# Patient Record
Sex: Male | Born: 1990
Health system: Southern US, Community
[De-identification: ages and names within clinical notes are randomized; demographics above are authoritative.]

## PROBLEM LIST (undated history)

## (undated) DIAGNOSIS — F649 Gender identity disorder, unspecified: Secondary | ICD-10-CM

## (undated) DIAGNOSIS — F419 Anxiety disorder, unspecified: Secondary | ICD-10-CM

## (undated) DIAGNOSIS — J45909 Unspecified asthma, uncomplicated: Secondary | ICD-10-CM

## (undated) DIAGNOSIS — E669 Obesity, unspecified: Secondary | ICD-10-CM

## (undated) DIAGNOSIS — J189 Pneumonia, unspecified organism: Secondary | ICD-10-CM

## (undated) HISTORY — DX: Pneumonia, unspecified organism: J18.9

## (undated) HISTORY — DX: Gender identity disorder, unspecified: F64.9

## (undated) HISTORY — DX: Obesity, unspecified: E66.9

## (undated) HISTORY — DX: Anxiety disorder, unspecified: F41.9

## (undated) HISTORY — PX: NO PAST SURGERIES: SHX2092

## (undated) HISTORY — PX: MASTECTOMY: SHX3

---

## 2017-02-02 DIAGNOSIS — E281 Androgen excess: Secondary | ICD-10-CM | POA: Diagnosis not present

## 2017-03-30 DIAGNOSIS — E281 Androgen excess: Secondary | ICD-10-CM | POA: Diagnosis not present

## 2017-03-30 DIAGNOSIS — D751 Secondary polycythemia: Secondary | ICD-10-CM | POA: Diagnosis not present

## 2017-03-30 DIAGNOSIS — K719 Toxic liver disease, unspecified: Secondary | ICD-10-CM | POA: Diagnosis not present

## 2017-08-10 DIAGNOSIS — J101 Influenza due to other identified influenza virus with other respiratory manifestations: Secondary | ICD-10-CM | POA: Diagnosis not present

## 2018-02-04 DIAGNOSIS — F640 Transsexualism: Secondary | ICD-10-CM | POA: Diagnosis not present

## 2018-03-01 DIAGNOSIS — F641 Gender identity disorder in adolescence and adulthood: Secondary | ICD-10-CM | POA: Diagnosis not present

## 2018-03-04 DIAGNOSIS — F640 Transsexualism: Secondary | ICD-10-CM | POA: Diagnosis not present

## 2018-04-20 DIAGNOSIS — F4323 Adjustment disorder with mixed anxiety and depressed mood: Secondary | ICD-10-CM | POA: Diagnosis not present

## 2018-04-20 DIAGNOSIS — F4325 Adjustment disorder with mixed disturbance of emotions and conduct: Secondary | ICD-10-CM | POA: Diagnosis not present

## 2018-04-20 DIAGNOSIS — F640 Transsexualism: Secondary | ICD-10-CM | POA: Diagnosis not present

## 2018-04-28 DIAGNOSIS — F640 Transsexualism: Secondary | ICD-10-CM | POA: Diagnosis not present

## 2018-04-28 DIAGNOSIS — Z79890 Hormone replacement therapy (postmenopausal): Secondary | ICD-10-CM | POA: Diagnosis not present

## 2018-04-29 LAB — HM PAP SMEAR: HM Pap smear: NEGATIVE

## 2018-05-02 DIAGNOSIS — F640 Transsexualism: Secondary | ICD-10-CM | POA: Insufficient documentation

## 2018-05-02 DIAGNOSIS — Z789 Other specified health status: Secondary | ICD-10-CM | POA: Insufficient documentation

## 2018-06-03 DIAGNOSIS — F4325 Adjustment disorder with mixed disturbance of emotions and conduct: Secondary | ICD-10-CM | POA: Diagnosis not present

## 2018-06-03 DIAGNOSIS — F4323 Adjustment disorder with mixed anxiety and depressed mood: Secondary | ICD-10-CM | POA: Diagnosis not present

## 2018-06-03 DIAGNOSIS — F640 Transsexualism: Secondary | ICD-10-CM | POA: Diagnosis not present

## 2018-08-24 DIAGNOSIS — F4325 Adjustment disorder with mixed disturbance of emotions and conduct: Secondary | ICD-10-CM | POA: Diagnosis not present

## 2018-08-24 DIAGNOSIS — F640 Transsexualism: Secondary | ICD-10-CM | POA: Diagnosis not present

## 2018-08-24 DIAGNOSIS — F4323 Adjustment disorder with mixed anxiety and depressed mood: Secondary | ICD-10-CM | POA: Diagnosis not present

## 2018-08-24 DIAGNOSIS — F4011 Social phobia, generalized: Secondary | ICD-10-CM | POA: Diagnosis not present

## 2018-09-02 DIAGNOSIS — F4325 Adjustment disorder with mixed disturbance of emotions and conduct: Secondary | ICD-10-CM | POA: Diagnosis not present

## 2018-09-02 DIAGNOSIS — F640 Transsexualism: Secondary | ICD-10-CM | POA: Diagnosis not present

## 2018-09-02 DIAGNOSIS — F4323 Adjustment disorder with mixed anxiety and depressed mood: Secondary | ICD-10-CM | POA: Diagnosis not present

## 2018-09-20 ENCOUNTER — Encounter: Payer: Self-pay | Admitting: Physician Assistant

## 2018-09-20 ENCOUNTER — Ambulatory Visit: Payer: Self-pay | Admitting: Physician Assistant

## 2018-09-20 ENCOUNTER — Ambulatory Visit (INDEPENDENT_AMBULATORY_CARE_PROVIDER_SITE_OTHER): Payer: BLUE CROSS/BLUE SHIELD | Admitting: Physician Assistant

## 2018-09-20 VITALS — Ht 61.0 in | Wt 210.0 lb

## 2018-09-20 DIAGNOSIS — R0609 Other forms of dyspnea: Secondary | ICD-10-CM

## 2018-09-20 DIAGNOSIS — Z7289 Other problems related to lifestyle: Secondary | ICD-10-CM | POA: Insufficient documentation

## 2018-09-20 DIAGNOSIS — Z832 Family history of diseases of the blood and blood-forming organs and certain disorders involving the immune mechanism: Secondary | ICD-10-CM

## 2018-09-20 DIAGNOSIS — F411 Generalized anxiety disorder: Secondary | ICD-10-CM | POA: Diagnosis not present

## 2018-09-20 DIAGNOSIS — Z131 Encounter for screening for diabetes mellitus: Secondary | ICD-10-CM

## 2018-09-20 DIAGNOSIS — Z1329 Encounter for screening for other suspected endocrine disorder: Secondary | ICD-10-CM

## 2018-09-20 DIAGNOSIS — F410 Panic disorder [episodic paroxysmal anxiety] without agoraphobia: Secondary | ICD-10-CM | POA: Insufficient documentation

## 2018-09-20 DIAGNOSIS — E6609 Other obesity due to excess calories: Secondary | ICD-10-CM | POA: Insufficient documentation

## 2018-09-20 DIAGNOSIS — Z7722 Contact with and (suspected) exposure to environmental tobacco smoke (acute) (chronic): Secondary | ICD-10-CM

## 2018-09-20 DIAGNOSIS — E66812 Obesity, class 2: Secondary | ICD-10-CM | POA: Insufficient documentation

## 2018-09-20 DIAGNOSIS — R0600 Dyspnea, unspecified: Secondary | ICD-10-CM

## 2018-09-20 DIAGNOSIS — F331 Major depressive disorder, recurrent, moderate: Secondary | ICD-10-CM | POA: Diagnosis not present

## 2018-09-20 DIAGNOSIS — Z7689 Persons encountering health services in other specified circumstances: Secondary | ICD-10-CM

## 2018-09-20 MED ORDER — ALPRAZOLAM 0.25 MG PO TABS: 0.2500 mg | ORAL_TABLET | Freq: Once | ORAL | 0 refills | Status: DC | PRN

## 2018-09-20 MED ORDER — ESCITALOPRAM OXALATE 10 MG PO TABS: ORAL_TABLET | ORAL | 0 refills | Status: DC

## 2018-09-20 MED ORDER — BUDESONIDE-FORMOTEROL FUMARATE 80-4.5 MCG/ACT IN AERO: 2.0000 | INHALATION_SPRAY | Freq: Two times a day (BID) | RESPIRATORY_TRACT | 0 refills | Status: DC

## 2018-09-20 NOTE — Progress Notes (Signed)
Virtual Visit via Video Note  I connected with Peter Mccullough on 09/20/18 at  3:00 PM EDT by a video enabled telemedicine application and verified that I am speaking with the correct person using two identifiers.   I discussed the limitations of evaluation and management by telemedicine and the availability of in person appointments. The patient expressed understanding and agreed to proceed.  History of Present Illness: HPI:                                                                Peter Mccullough is a 28 y.o. adult   CC: establish care, SOB, anxiety  Peter Mccullough reports long-standing, chronic "breathing issues" for most of his life, but gradually worsening over the last 3 years. States "I feel like I'm drowning in my lungs."  Worse mainly with exertion and hot temperatures and seems to be worse in the spring season. 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 Endorses wheezing and intermittent dry cough and rare sharp retrosternal chest pain that radiates to the right shoulder Endorses chronic secondhand smoke exposure for 20 years Mother had sarcoidosis No prior work-up or treatments  Anxiety/Depression: Endorses longstanding social anxiety and panic attacks. Has been seeing a therapist since August 2018 (Peter Mccullough). Therapist recommend he follow-up with PCP to discuss anxiety medication. Therapist also suggested he may have seasonal affective disorder.  Reports in 2011 (age 24) he was forced to see a counselor through his school for suicidal ideations. He states he was cutting at the time and had felt like he didn't want to continue living. He states these feelings come and go. He last cut May 18, 2018; prior to this had not cut for 2 years.  He states he recurrently feels hopeless, numb and lethargic especially when his gender dysphoria is flaring  up Reports he has been having trouble sleeping for several months and has been taking Melatonin 5 mg nightly for the last few weeks. Has tried relaxation exercises and this was not helpful.   Reports he was having panic attacks daily for a couple of weeks, triggered by health concerns due to COVID-19. States symptoms have made it difficult to go to work at [employer]  He lives at home with his uncle and has a supportive girlfriend and a close friend who is like family.  No prior medications or hospitalizations. No substance use.   Depression screen PHQ 2/9 09/20/2018  Decreased Interest 1  Down, Depressed, Hopeless 2  PHQ - 2 Score 3  Altered sleeping 3  Tired, decreased energy 2  Change in appetite 1  Feeling bad or failure about yourself  2  Trouble concentrating 2  Moving slowly or fidgety/restless 1  Suicidal thoughts 0  PHQ-9 Score 14    GAD 7 : Generalized Anxiety Score 09/20/2018  Nervous, Anxious, on Edge 2  Control/stop worrying 1  Worry too much - different things 2  Trouble relaxing 2  Restless 1  Easily annoyed or irritable 2  Afraid - awful might happen 2  Total GAD 7 Score 12      Past Medical History:  Diagnosis Date  . Anxiety   .  Gender dysphoria   . Obesity    Past Surgical History:  Procedure Laterality Date  . NO PAST SURGERIES     Social History   Tobacco Use  . Smoking status: Passive Smoke Exposure - Never Smoker  . Smokeless tobacco: Never Used  Substance Use Topics  . Alcohol use: Not Currently    Comment: 1 drink/year   family history includes COPD in his maternal uncle; Sarcoidosis in his mother.    Review of Systems  Respiratory: Positive for chest tightness, shortness of breath and wheezing.   Skin:       + hair thinning  Psychiatric/Behavioral: Positive for dysphoric mood, self-injury (cutting, 12/19), sleep disturbance and suicidal ideas (chronic, passive). The patient is nervous/anxious.   All other systems reviewed and are  negative.    Medications: Current Outpatient Medications  Medication Sig Dispense Refill  . Melatonin 5 MG CAPS Take by mouth.    . Multiple Vitamin (MULTIVITAMIN) capsule Take 1 capsule by mouth daily.    Marland Kitchen NEEDLE, DISP, 25 G (BD HYPODERMIC NEEDLE) 25G X 1-1/2" MISC For testosterone Injection    . SYRINGE-NEEDLE, DISP, 3 ML (B-D 3CC LUER-LOK SYR 18GX1-1/2) 18G X 1-1/2" 3 ML MISC For drawing Testosterone    . Testosterone Cypionate 200 MG/ML SOLN Inject 120 mg into the muscle every 14 (fourteen) days.    . ALPRAZolam (XANAX) 0.25 MG tablet Take 1-2 tablets (0.25-0.5 mg total) by mouth once as needed for up to 1 dose for anxiety. 20 tablet 0  . budesonide-formoterol (SYMBICORT) 80-4.5 MCG/ACT inhaler Inhale 2 puffs into the lungs 2 (two) times a day. 1 Inhaler 0  . escitalopram (LEXAPRO) 10 MG tablet Take 0.5 tablets (5 mg total) by mouth at bedtime for 3 days, THEN 1 tablet (10 mg total) at bedtime for 27 days. 30 tablet 0   No current facility-administered medications for this visit.    No Known Allergies     Objective:  Ht  (1.549 m)   Wt 210 lb (95.3 kg)   BMI 39.68 kg/m  Gen:  alert, not ill-appearing, no distress, appropriate for age HEENT: head normocephalic without obvious abnormality, conjunctiva and cornea clear, trachea midline Pulm: Normal work of breathing, normal phonation, speaking in full sentences Neuro: alert and oriented x 3, no tremor MSK: extremities atraumatic, normal gait and station Skin: intact, no rashes on exposed skin, no jaundice, no cyanosis Psych: well-groomed, cooperative, good eye contact, euthymic mood, affect mood-congruent, speech is articulate, and thought processes clear and goal-directed  Labs Care Everywhere 04/28/2018  WBC 8.6 RBC 5.83 Hgb 17.5 Plt 320  Vital Signs 04/28/2018 BP 140/72 Pulse 76   No results found for this or any previous visit (from the past 72 hour(s)). No results found.    Assessment and Plan: 28  y.o. adult with   .Peter Mccullough was seen today for establish care.  Diagnoses and all orders for this visit:  Encounter to establish care  Moderate episode of recurrent major depressive disorder (HCC) -     escitalopram (LEXAPRO) 10 MG tablet; Take 0.5 tablets (5 mg total) by mouth at bedtime for 3 days, THEN 1 tablet (10 mg total) at bedtime for 27 days.  Generalized anxiety disorder with panic attacks -     escitalopram (LEXAPRO) 10 MG tablet; Take 0.5 tablets (5 mg total) by mouth at bedtime for 3 days, THEN 1 tablet (10 mg total) at bedtime for 27 days. -     ALPRAZolam (XANAX) 0.25 MG tablet;  Take 1-2 tablets (0.25-0.5 mg total) by mouth once as needed for up to 1 dose for anxiety.  Family history of sarcoidosis Comments: mother Orders: -     CBC with Differential/Platelet -     COMPLETE METABOLIC PANEL WITH GFR -     Sedimentation rate -     C-reactive protein -     TSH + free T4 -     B Nat Peptide -     DG Chest 2 View -     QuantiFERON-TB Gold Plus  Chronic dyspnea -     budesonide-formoterol (SYMBICORT) 80-4.5 MCG/ACT inhaler; Inhale 2 puffs into the lungs 2 (two) times a day. -     CBC with Differential/Platelet -     COMPLETE METABOLIC PANEL WITH GFR -     Sedimentation rate -     C-reactive protein -     TSH + free T4 -     B Nat Peptide -     DG Chest 2 View -     QuantiFERON-TB Gold Plus  Passive smoke exposure  Class 2 obesity due to excess calories in adult, unspecified BMI, unspecified whether serious comorbidity present -     Hemoglobin A1c -     TSH + free T4  Screening for diabetes mellitus -     Hemoglobin A1c  Screening for thyroid disorder -     TSH + free T4   - Personally reviewed PMH, PSH, PFH, medications, allergies, HM - Personally reviewed labs Care Everywhere 04/2018 - Age-appropriate cancer screening: Pap UTD 04/28/18, NILM, repeat 3 years - Positive depression screening (See plan below)  MDD, Panic Disorder, Hx of cutting GAD7=12,  PHQ9=14, no acute safety issues, safety plan reviewed and written plan sent via MyChart Lexapro self-titration Reviewed mechanism, potential AE and suicidality warning for SSRI Alprazolam prn for panic attacks, reviewed risks of benzodiazepines including dependence Keep f/u with counselor  Chronic Dyspnea,  Chronic passive smoke exposure, subjective wheezing, Fam hx of sarcoidosis in mother DDx is broad and includes reactive airway disease, obesity-hypoventilation syndrome, restrictive lung disease, panic disorder, deconditioning No vital signs provided today and limited physical exam due to e-visit. However, reassuring that symptoms have been unchanged for 3 years and emergent cause including PE is unlikely CXR and labs pending. Recent CBC wnl, which r/o anemia Will need spirometry at next OV Trial of Symbicort, counseled on MDI technique  Follow-up in office in 1 month or sooner as needed   Follow Up Instructions:    I discussed the assessment and treatment plan with the patient. The patient was provided an opportunity to ask questions and all were answered. The patient agreed with the plan and demonstrated an understanding of the instructions.   The patient was advised to call back or seek an in-person evaluation if the symptoms worsen or if the condition fails to improve as anticipated.  I provided 45 minutes of non-face-to-face time during this encounter.   Carlis Stableharley Elizabeth , [place]PA-C

## 2018-09-24 ENCOUNTER — Encounter: Payer: Self-pay | Admitting: Physician Assistant

## 2018-10-12 ENCOUNTER — Other Ambulatory Visit: Payer: Self-pay | Admitting: Physician Assistant

## 2018-10-12 DIAGNOSIS — R0609 Other forms of dyspnea: Secondary | ICD-10-CM

## 2018-10-12 DIAGNOSIS — F331 Major depressive disorder, recurrent, moderate: Secondary | ICD-10-CM

## 2018-10-12 DIAGNOSIS — F410 Panic disorder [episodic paroxysmal anxiety] without agoraphobia: Secondary | ICD-10-CM

## 2018-10-14 ENCOUNTER — Ambulatory Visit (INDEPENDENT_AMBULATORY_CARE_PROVIDER_SITE_OTHER): Payer: BLUE CROSS/BLUE SHIELD

## 2018-10-14 ENCOUNTER — Ambulatory Visit (INDEPENDENT_AMBULATORY_CARE_PROVIDER_SITE_OTHER): Payer: BLUE CROSS/BLUE SHIELD | Admitting: Physician Assistant

## 2018-10-14 ENCOUNTER — Other Ambulatory Visit: Payer: Self-pay

## 2018-10-14 ENCOUNTER — Encounter: Payer: Self-pay | Admitting: Physician Assistant

## 2018-10-14 VITALS — BP 121/66 | HR 81 | Temp 98.3°F | Resp 14 | Ht 60.0 in | Wt 211.0 lb

## 2018-10-14 DIAGNOSIS — R0600 Dyspnea, unspecified: Secondary | ICD-10-CM | POA: Diagnosis not present

## 2018-10-14 DIAGNOSIS — Z131 Encounter for screening for diabetes mellitus: Secondary | ICD-10-CM | POA: Diagnosis not present

## 2018-10-14 DIAGNOSIS — F331 Major depressive disorder, recurrent, moderate: Secondary | ICD-10-CM

## 2018-10-14 DIAGNOSIS — R0602 Shortness of breath: Secondary | ICD-10-CM | POA: Diagnosis not present

## 2018-10-14 DIAGNOSIS — Z832 Family history of diseases of the blood and blood-forming organs and certain disorders involving the immune mechanism: Secondary | ICD-10-CM | POA: Diagnosis not present

## 2018-10-14 DIAGNOSIS — Z9189 Other specified personal risk factors, not elsewhere classified: Secondary | ICD-10-CM | POA: Diagnosis not present

## 2018-10-14 DIAGNOSIS — F411 Generalized anxiety disorder: Secondary | ICD-10-CM

## 2018-10-14 DIAGNOSIS — R0683 Snoring: Secondary | ICD-10-CM

## 2018-10-14 DIAGNOSIS — R0609 Other forms of dyspnea: Secondary | ICD-10-CM

## 2018-10-14 DIAGNOSIS — F410 Panic disorder [episodic paroxysmal anxiety] without agoraphobia: Secondary | ICD-10-CM

## 2018-10-14 DIAGNOSIS — Z1329 Encounter for screening for other suspected endocrine disorder: Secondary | ICD-10-CM | POA: Diagnosis not present

## 2018-10-14 MED ORDER — ESCITALOPRAM OXALATE 10 MG PO TABS: 10.0000 mg | ORAL_TABLET | Freq: Every day | ORAL | 0 refills | Status: DC

## 2018-10-14 MED ORDER — BUDESONIDE-FORMOTEROL FUMARATE 80-4.5 MCG/ACT IN AERO: 2.0000 | INHALATION_SPRAY | Freq: Two times a day (BID) | RESPIRATORY_TRACT | 3 refills | Status: DC

## 2018-10-14 NOTE — Progress Notes (Signed)
HPI:                                                                Peter Mccullough is a 28 y.o. adult who presents to [facility] today for anxiety and SOB follow-up  Anxiety/Depression: Reports anxiety has improved since starting Lexapro 10 mg approx 3 weeks ago. Has not had any panic attacks since last visit. Has not needed to use Alprazolam  Endorses longstanding social anxiety and panic attacks. Has been seeing a therapist since August 2018 (Dr. Carolin Sicks). Therapist recommend he follow-up with PCP to discuss anxiety medication. Therapist also suggested he may have seasonal affective disorder.  Reports in 2011 (age 85) he was forced to see a counselor through his school for suicidal ideations. He states he was cutting at the time and had felt like he didn't want to continue living. He states these feelings come and go. He last cut May 18, 2018; prior to this had not cut for 2 years.  He states he recurrently feels hopeless, numb and lethargic especially when his gender dysphoria is flaring up Reports he has been having trouble sleeping for several months and has been taking Melatonin 5 mg nightly for the last few weeks. Has tried relaxation exercises and this was not helpful.   Reports he was having panic attacks daily for a couple of weeks, triggered by health concerns due to COVID-19. States symptoms have made it difficult to go to work at [employer]  He lives at home with his uncle and has a supportive girlfriend and a close friend who is like family.  No prior medications or hospitalizations. No substance use.  SOB: - reports Symbicort has been helpful for SOB. SOB has been worse with activity, especially wearing a mask at work. Requesting letter to take voluntary leave of absence - continues to endorse barking cough, wheezing and chest tightness - endorses loud snoring. Denies witness apnea. No orthopnea or PND  Peter Mccullough  reports long-standing, chronic "breathing issues" for most of his life, but gradually worsening over the last 3 years. States "I feel like I'm drowning in my lungs."  Worse mainly with exertion and hot temperatures and seems to be worse in the spring season. 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 Endorses wheezing and intermittent dry cough and rare sharp retrosternal chest pain that radiates to the right shoulder Endorses chronic secondhand smoke exposure for 20 years Mother had sarcoidosis No prior work-up or treatments   Depression screen [facility] 2/9 10/14/2018 09/20/2018  Decreased Interest 1 1  Down, Depressed, Hopeless 1 2  PHQ - 2 Score 2 3  Altered sleeping 2 3  Tired, decreased energy 2 2  Change in appetite 0 1  Feeling bad or failure about yourself  1 2  Trouble concentrating 1 2  Moving slowly or fidgety/restless 1 1  Suicidal thoughts 1 0  PHQ-9 Score 10 14  Difficult doing work/chores Somewhat difficult -    GAD 7 : Generalized Anxiety Score 10/14/2018 09/20/2018  Nervous, Anxious, on Edge 1 2  Control/stop worrying 1 1  Worry too much -  different things 1 2  Trouble relaxing 1 2  Restless 1 1  Easily annoyed or irritable 1 2  Afraid - awful might happen 2 2  Total GAD 7 Score 8 12      Past Medical History:  Diagnosis Date  . Anxiety   . Gender dysphoria   . Obesity    Past Surgical History:  Procedure Laterality Date  . NO PAST SURGERIES     Social History   Tobacco Use  . Smoking status: Passive Smoke Exposure - Never Smoker  . Smokeless tobacco: Never Used  Substance Use Topics  . Alcohol use: Not Currently    Comment: 1 drink/year   family history includes COPD in his maternal uncle; Sarcoidosis in his mother.    ROS: negative except as noted in the HPI  Medications: Current Outpatient Medications  Medication  Sig Dispense Refill  . ALPRAZolam (XANAX) 0.25 MG tablet Take 1-2 tablets (0.25-0.5 mg total) by mouth once as needed for up to 1 dose for anxiety. 20 tablet 0  . budesonide-formoterol (SYMBICORT) 80-4.5 MCG/ACT inhaler Inhale 2 puffs into the lungs 2 (two) times a day. 10.2 Inhaler 3  . escitalopram (LEXAPRO) 10 MG tablet Take 1 tablet (10 mg total) by mouth at bedtime. 90 tablet 0  . Melatonin 5 MG CAPS Take by mouth.    . Multiple Vitamin (MULTIVITAMIN) capsule Take 1 capsule by mouth daily.    Marland Kitchen NEEDLE, DISP, 25 G (BD HYPODERMIC NEEDLE) 25G X 1-1/2" MISC For testosterone Injection    . SYRINGE-NEEDLE, DISP, 3 ML (B-D 3CC LUER-LOK SYR 18GX1-1/2) 18G X 1-1/2" 3 ML MISC For drawing Testosterone    . Testosterone Cypionate 200 MG/ML SOLN Inject 120 mg into the muscle every 14 (fourteen) days.     No current facility-administered medications for this visit.    No Known Allergies     Objective:  BP 121/66   Pulse 81   Temp 98.3 F (36.8 C) (Oral)   Resp 14   Ht 5' (1.524 m)   Wt 211 lb (95.7 kg)   SpO2 96%   BMI 41.21 kg/m  Gen:  alert, not ill-appearing, no distress, appropriate for age, obese adult HEENT: head normocephalic without obvious abnormality, conjunctiva and cornea clear, oropharynx clear, tonsils grade 2+ without exudates, uvula midline, trachea midline Pulm: Normal work of breathing, normal phonation, clear to auscultation bilaterally, no wheezes, rales or rhonchi CV: Normal rate, regular rhythm, s1 and s2 distinct, no murmurs, clicks or rubs  Neuro: alert and oriented x 3, no tremor MSK: extremities atraumatic, normal gait and station, no peripheral edema Skin: intact, no rashes on exposed skin, no jaundice, no cyanosis Psych: appearance casual, cooperative, good eye contact, euthymic mood, affect mood-congruent, speech is articulate, and thought processes clear and goal-directed  Recent Results (from the past 2160 hour(s))  CBC with Differential/Platelet      Status: Abnormal   Collection Time: 10/14/18  4:08 PM  Result Value Ref Range   WBC 8.7 3.8 - 10.8 Thousand/uL   RBC 5.70 4.20 - 5.80 Million/uL   Hemoglobin 16.8 13.2 - 17.1 g/dL   HCT 50.8 (H) 38.5 - 50.0 %   MCV 89.1 80.0 - 100.0 fL   MCH 29.5 27.0 - 33.0 pg   MCHC 33.1 32.0 - 36.0 g/dL   RDW 12.4 11.0 - 15.0 %   Platelets 302 140 - 400 Thousand/uL   MPV 10.2 7.5 - 12.5 fL   Neutro Abs 5,446 1,500 - 7,800  cells/uL   Lymphs Abs 2,105 850 - 3,900 cells/uL   Absolute Monocytes 983 (H) 200 - 950 cells/uL   Eosinophils Absolute 122 15 - 500 cells/uL   Basophils Absolute 44 0 - 200 cells/uL   Neutrophils Relative % 62.6 %   Total Lymphocyte 24.2 %   Monocytes Relative 11.3 %   Eosinophils Relative 1.4 %   Basophils Relative 0.5 %  COMPLETE METABOLIC PANEL WITH GFR     Status: None   Collection Time: 10/14/18  4:08 PM  Result Value Ref Range   Glucose, Bld 85 65 - 139 mg/dL    Comment: .        Non-fasting reference interval .    BUN 13 7 - 25 mg/dL   Creat 0.97 0.60 - 1.35 mg/dL   GFR, Est Non African American 107 > OR = 60 mL/min/1.1m   GFR, Est African American 123 > OR = 60 mL/min/1.725m  BUN/Creatinine Ratio NOT APPLICABLE 6 - 22 (calc)   Sodium 140 135 - 146 mmol/L   Potassium 4.3 3.5 - 5.3 mmol/L   Chloride 104 98 - 110 mmol/L   CO2 25 20 - 32 mmol/L   Calcium 9.8 8.6 - 10.3 mg/dL   Total Protein 7.0 6.1 - 8.1 g/dL   Albumin 4.5 3.6 - 5.1 g/dL   Globulin 2.5 1.9 - 3.7 g/dL (calc)   AG Ratio 1.8 1.0 - 2.5 (calc)   Total Bilirubin 0.8 0.2 - 1.2 mg/dL   Alkaline phosphatase (APISO) 71 36 - 130 U/L   AST 17 10 - 40 U/L   ALT 13 9 - 46 U/L  Hemoglobin A1c     Status: None   Collection Time: 10/14/18  4:08 PM  Result Value Ref Range   Hgb A1c MFr Bld 5.0 <5.7 % of total Hgb    Comment: For the purpose of screening for the presence of diabetes: . <5.7%       Consistent with the absence of diabetes 5.7-6.4%    Consistent with increased risk for diabetes              (prediabetes) > or =6.5%  Consistent with diabetes . This assay result is consistent with a decreased risk of diabetes. . Currently, no consensus exists regarding use of hemoglobin A1c for diagnosis of diabetes in children. . According to American Diabetes Association (ADA) guidelines, hemoglobin A1c <7.0% represents optimal control in non-pregnant diabetic patients. Different metrics may apply to specific patient populations.  Standards of Medical Care in Diabetes(ADA). .    Mean Plasma Glucose 97 (calc)   eAG (mmol/L) 5.4 (calc)  Sedimentation rate     Status: None   Collection Time: 10/14/18  4:08 PM  Result Value Ref Range   Sed Rate 2 0 - 15 mm/h  C-reactive protein     Status: None   Collection Time: 10/14/18  4:08 PM  Result Value Ref Range   CRP 2.4 <8.0 mg/L  TSH + free T4     Status: None   Collection Time: 10/14/18  4:08 PM  Result Value Ref Range   TSH W/REFLEX TO FT4 2.31 0.40 - 4.50 mIU/L  B Nat Peptide     Status: None   Collection Time: 10/14/18  4:08 PM  Result Value Ref Range   Brain Natriuretic Peptide 12 <100 pg/mL    Comment: . BNP levels increase with age in the general population with the highest values seen in individuals greater than 75 years  of age. Reference: J. Am. [place] Ar. Cardiol. 2002; 58:682-574. Ronney Asters Plus     Status: None   Collection Time: 10/14/18  4:08 PM  Result Value Ref Range   QuantiFERON-TB Gold Plus NEGATIVE NEGATIVE    Comment: Negative test result. M. tuberculosis complex  infection unlikely.    NIL 0.02 IU/mL   Mitogen-NIL 8.49 IU/mL   TB1-NIL 0.00 IU/mL   TB2-NIL 0.01 IU/mL    Comment: . The Nil tube value reflects the background interferon gamma immune response of the patient's blood sample. This value has been subtracted from the patient's displayed TB and Mitogen results. . Lower than expected results with the Mitogen tube prevent false-negative Quantiferon readings by detecting a patient  with a potential immune suppressive condition and/or suboptimal pre-analytical specimen handling. . The TB1 Antigen tube is coated with the M. tuberculosis-specific antigens designed to elicit responses from TB antigen primed CD4+ helper T-lymphocytes. . The TB2 Antigen tube is coated with the M. tuberculosis-specific antigens designed to elicit responses from TB antigen primed CD4+ helper and CD8+ cytotoxic T-lymphocytes. . For additional information, please refer to https://education.questdiagnostics.com/faq/FAQ204 (This link is being provided for informational/ educational purposes only.) .      No results found for this or any previous visit (from the past 72 hour(s)). No results found.    Assessment and Plan: 28 y.o. adult with   .Peter Mccullough was seen today for shortness of breath and anxiety.  Diagnoses and all orders for this visit:  Generalized anxiety disorder with panic attacks -     escitalopram (LEXAPRO) 10 MG tablet; Take 1 tablet (10 mg total) by mouth at bedtime.  Moderate episode of recurrent major depressive disorder (HCC) -     escitalopram (LEXAPRO) 10 MG tablet; Take 1 tablet (10 mg total) by mouth at bedtime.  Chronic dyspnea -     budesonide-formoterol (SYMBICORT) 80-4.5 MCG/ACT inhaler; Inhale 2 puffs into the lungs 2 (two) times a day. -     DG Chest 2 View  At risk for obstructive sleep apnea -     Home sleep test; Future -     Home sleep test  Snoring -     Home sleep test; Future -     Home sleep test  Chronic Dyspnea Pulse ox 96% on RA at rest, no adventitious lung sounds Suspect underlying reactive airway disease, especially given chronic passive smoke exposure Continue Symbicort 2 puff bid PFT's deferred due to COVID-19 aerosolization restrictions Plan to perform PFT in approx 2 months to confirm presence of reversible obstruction CXR pending CBC, ESR, CRP, Quantiferon gold pending Home sleep test pending to assess for OSA.  +STOPBANG, BMI>35, male/testosterone use, snoring Work note provided  GAD, Depression PHQ9=10, improved from 14 GAD7=8, improved from 12 Partial response to SSRI. Cont Lexapro 10 mg QD Cont Alprazolam 0.25 mg prn for breakthrough anxiety/panic  Patient education and anticipatory guidance given Patient agrees with treatment plan Follow-up in 2 months or sooner as needed if symptoms worsen or fail to improve  Darlyne Russian PA-C

## 2018-10-14 NOTE — Patient Instructions (Addendum)
Asthma Attack Prevention, Adult  Although you may not be able to control the fact that you have asthma, you can take actions to prevent episodes of asthma (asthma attacks). These actions include:   Creating a written plan for managing and treating your asthma attacks (asthma action plan).   Monitoring your asthma.   Avoiding things that can irritate your airways or make your asthma symptoms worse (asthma triggers).   Taking your medicines as directed.   Acting quickly if you have signs or symptoms of an asthma attack.  What are some ways to prevent an asthma attack?  Create a plan  Work with your health care provider to create an asthma action plan. This plan should include:   A list of your asthma triggers and how to avoid them.   A list of symptoms that you experience during an asthma attack.   Information about when to take medicine and how much medicine to take.   Information to help you understand your peak flow measurements.   Contact information for your health care providers.   Daily actions that you can take to control asthma.  Monitor your asthma  To monitor your asthma:   Use your peak flow meter every morning and every evening for 2-3 weeks. Record the results in a journal. A drop in your peak flow numbers on one or more days may mean that you are starting to have an asthma attack, even if you are not having symptoms.   When you have asthma symptoms, write them down in a journal.    Avoid asthma triggers  Work with your health care provider to find out what your asthma triggers are. This can be done by:   Being tested for allergies.   Keeping a journal that notes when asthma attacks occur and what may have contributed to them.   Asking your health care provider whether other medical conditions make your asthma worse.  Common asthma triggers include:   Dust.   Smoke. This includes campfire smoke and secondhand smoke from tobacco products.   Pet dander.   Trees, grasses or  pollens.   Very cold, dry, or humid air.   Mold.   Foods that contain high amounts of sulfites.   Strong smells.   Engine exhaust and air pollution.   Aerosol sprays and fumes from household cleaners.   Household pests and their droppings, including dust mites and cockroaches.   Certain medicines, including NSAIDs.  Once you have determined your asthma triggers, take steps to avoid them. Depending on your triggers, you may be able to reduce the chance of an asthma attack by:   Keeping your home clean. Have someone dust and vacuum your home for you 1 or 2 times a week. If possible, have them use a high-efficiency particulate arrestance (HEPA) vacuum.   Washing your sheets weekly in hot water.   Using allergy-proof mattress covers and casings on your bed.   Keeping pets out of your home.   Taking care of mold and water problems in your home.   Avoiding areas where people smoke.   Avoiding using strong perfumes or odor sprays.   Avoid spending a lot of time outdoors when pollen counts are high and on very windy days.   Talking with your health care provider before stopping or starting any new medicines.  Medicines  Take over-the-counter and prescription medicines only as told by your health care provider. Many asthma attacks can be prevented by carefully following your   medicine schedule. Taking your medicines correctly is especially important when you cannot avoid certain asthma triggers. Even if you are doing well, do not stop taking your medicine and do not take less medicine.  Act quickly  If an asthma attack happens, acting quickly can decrease how severe it is and how long it lasts. Take these actions:   Pay attention to your symptoms. If you are coughing, wheezing, or having difficulty breathing, do not wait to see if your symptoms go away on their own. Follow your asthma action plan.   If you have followed your asthma action plan and your symptoms are not improving, call your health care  provider or seek immediate medical care at the nearest hospital.  It is important to write down how often you need to use your fast-acting rescue inhaler. You can track how often you use an inhaler in your journal. If you are using your rescue inhaler more often, it may mean that your asthma is not under control. Adjusting your asthma treatment plan may help you to prevent future asthma attacks and help you to gain better control of your condition.  How can I prevent an asthma attack when I exercise?  Exercise is a common asthma trigger. To prevent asthma attacks during exercise:   Follow advice from your health care provider about whether you should use your fast-acting inhaler before exercising. Many people with asthma experience exercise-induced bronchoconstriction (EIB). This condition often worsens during vigorous exercise in cold, humid, or dry environments. Usually, people with EIB can stay very active by using a fast-acting inhaler before exercising.   Avoid exercising outdoors in very cold or humid weather.   Avoid exercising outdoors when pollen counts are high.   Warm up and cool down when exercising.   Stop exercising right away if asthma symptoms start.  Consider taking part in exercises that are less likely to cause asthma symptoms such as:   Indoor swimming.   Biking.   Walking.   Hiking.   Playing football.  This information is not intended to replace advice given to you by your health care provider. Make sure you discuss any questions you have with your health care provider.  Document Released: 04/23/2009 Document Revised: 01/04/2016 Document Reviewed: 10/20/2015  Elsevier Interactive Patient Education  2019 [employer]  Asthma, Adult    Asthma is a long-term (chronic) condition that causes recurrent episodes in which the airways become tight and narrow. The airways are the passages that lead from the nose and mouth down into the lungs. Asthma episodes, also called asthma attacks, can  cause coughing, wheezing, shortness of breath, and chest pain. The airways can also fill with mucus. During an attack, it can be difficult to breathe. Asthma attacks can range from minor to life threatening.  Asthma cannot be cured, but medicines and lifestyle changes can help control it and treat acute attacks.  What are the causes?  This condition is believed to be caused by inherited (genetic) and environmental factors, but its exact cause is not known.  There are many things that can bring on an asthma attack or make asthma symptoms worse (triggers). Asthma triggers are different for each person. Common triggers include:   Mold.   Dust.   Cigarette smoke.   Cockroaches.   Things that can cause allergy symptoms (allergens), such as animal dander or pollen from trees or grass.   Air pollutants such as household cleaners, wood smoke, smog, or chemical odors.   Cold air,   weather changes, and winds (which increase molds and pollen in the air).   Strong emotional expressions such as crying or laughing hard.   Stress.   Certain medicines (such as aspirin) or types of medicines (such as beta-blockers).   Sulfites in foods and drinks. Foods and drinks that may contain sulfites include dried fruit, potato chips, and sparkling grape juice.   Infections or inflammatory conditions such as the flu, a cold, or inflammation of the nasal membranes (rhinitis).   Gastroesophageal reflux disease (GERD).   Exercise or strenuous activity.  What are the signs or symptoms?  Symptoms of this condition may occur right after asthma is triggered or many hours later. Symptoms include:   Wheezing. This can sound like whistling when you breathe.   Excessive nighttime or early morning coughing.   Frequent or severe coughing with a common cold.   Chest tightness.   Shortness of breath.   Tiredness (fatigue) with minimal activity.  How is this diagnosed?  This condition is diagnosed based on:   Your medical history.   A  physical exam.   Tests, which may include:  ? Lung function studies and pulmonary studies (spirometry). These tests can evaluate the flow of air in your lungs.  ? Allergy tests.  ? Imaging tests, such as X-rays.  How is this treated?  There is no cure for this condition, but treatment can help control your symptoms. Treatment for asthma usually involves:   Identifying and avoiding your asthma triggers.   Using medicines to control your symptoms. Generally, two types of medicines are used to treat asthma:  ? Controller medicines. These help prevent asthma symptoms from occurring. They are usually taken every day.  ? Fast-acting reliever or rescue medicines. These quickly relieve asthma symptoms by widening the narrow and tight airways. They are used as needed and provide short-term relief.   Using supplemental oxygen. This may be needed during a severe episode.   Using other medicines, such as:  ? Allergy medicines, such as antihistamines, if your asthma attacks are triggered by allergens.  ? Immune medicines (immunomodulators). These are medicines that help control the immune system.   Creating an asthma action plan. An asthma action plan is a written plan for managing and treating your asthma attacks. This plan includes:  ? A list of your asthma triggers and how to avoid them.  ? Information about when medicines should be taken and when their dosage should be changed.  ? Instructions about using a device called a peak flow meter. A peak flow meter measures how well the lungs are working and the severity of your asthma. It helps you monitor your condition.  Follow these instructions at home:  Controlling your home environment  Control your home environment in the following ways to help avoid triggers and prevent asthma attacks:   Change your heating and air conditioning filter regularly.   Limit your use of fireplaces and wood stoves.   Get rid of pests (such as roaches and mice) and their  droppings.   Throw away plants if you see mold on them.   Clean floors and dust surfaces regularly. Use unscented cleaning products.   Try to have someone else vacuum for you regularly. Stay out of rooms while they are being vacuumed and for a short while afterward. If you vacuum, use a dust mask from a hardware store, a double-layered or microfilter vacuum cleaner bag, or a vacuum cleaner with a HEPA filter.   Replace carpet   with wood, tile, or vinyl flooring. Carpet can trap dander and dust.   Use allergy-proof pillows, mattress covers, and box spring covers.   Keep your bedroom a trigger-free room.   Avoid pets and keep windows closed when allergens are in the air.   Wash beddings every week in hot water and dry them in a dryer.   Use blankets that are made of polyester or cotton.   Clean bathrooms and kitchens with bleach. If possible, have someone repaint the walls in these rooms with mold-resistant paint. Stay out of the rooms that are being cleaned and painted.   Wash your hands often with soap and water. If soap and water are not available, use hand sanitizer.   Do not allow anyone to smoke in your home.  General instructions   Take over-the-counter and prescription medicines only as told by your health care provider.  ? Speak with your health care provider if you have questions about how or when to take the medicines.  ? Make note if you are requiring more frequent dosages.   Do not use any products that contain nicotine or tobacco, such as cigarettes and e-cigarettes. If you need help quitting, ask your health care provider. Also, avoid being exposed to secondhand smoke.   Use a peak flow meter as told by your health care provider. Record and keep track of the readings.   Understand and use the asthma action plan to help minimize, or stop an asthma attack, without needing to seek medical care.   Make sure you stay up to date on your yearly vaccinations as told by your health care provider.  This may include vaccines for the flu and pneumonia.   Avoid outdoor activities when allergen counts are high and when air quality is low.   Wear a ski mask that covers your nose and mouth during outdoor winter activities. Exercise indoors on cold days if you can.   Warm up before exercising, and take time for a cool-down period after exercise.   Keep all follow-up visits as told by your health care provider. This is important.  Where to find more information   For information about asthma, turn to the Centers for Disease Control and Prevention at www.cdc.gov/asthma/faqs.htm   For air quality information, turn to AirNow at https://airnow.gov/  Contact a health care provider if:   You have wheezing, shortness of breath, or a cough even while you are taking medicine to prevent attacks.   The mucus you cough up (sputum) is thicker than usual.   Your sputum changes from clear or white to yellow, green, gray, or bloody.   Your medicines are causing side effects, such as a rash, itching, swelling, or trouble breathing.   You need to use a reliever medicine more than 2-3 times a week.   Your peak flow reading is still at 50-79% of your personal best after following your action plan for 1 hour.   You have a fever.  Get help right away if:   You are getting worse and do not respond to treatment during an asthma attack.   You are short of breath when at rest or when doing very little physical activity.   You have difficulty eating, drinking, or talking.   You have chest pain or tightness.   You develop a fast heartbeat or palpitations.   You have a bluish color to your lips or fingernails.   You are light-headed or dizzy, or you faint.   Your   peak flow reading is less than 50% of your personal best.   You feel too tired to breathe normally.  Summary   Asthma is a long-term (chronic) condition that causes recurrent episodes in which the airways become tight and narrow. These episodes can cause coughing,  wheezing, shortness of breath, and chest pain.   Asthma cannot be cured, but medicines and lifestyle changes can help control it and treat acute attacks.   Make sure you understand how to avoid triggers and how and when to use your medicines.   Asthma attacks can range from minor to life threatening. Get help right away if you have an asthma attack and do not respond to treatment with your usual rescue medicines.  This information is not intended to replace advice given to you by your health care provider. Make sure you discuss any questions you have with your health care provider.  Document Released: 05/05/2005 Document Revised: 06/09/2016 Document Reviewed: 06/09/2016  Elsevier Interactive Patient Education  2019 [employer] Inc.

## 2018-10-16 LAB — CBC WITH DIFFERENTIAL/PLATELET
Absolute Monocytes: 983 cells/uL — ABNORMAL HIGH (ref 200–950)
Basophils Absolute: 44 cells/uL (ref 0–200)
Basophils Relative: 0.5 %
Eosinophils Absolute: 122 cells/uL (ref 15–500)
Eosinophils Relative: 1.4 %
HCT: 50.8 % — ABNORMAL HIGH (ref 38.5–50.0)
Hemoglobin: 16.8 g/dL (ref 13.2–17.1)
Lymphs Abs: 2105 cells/uL (ref 850–3900)
MCH: 29.5 pg (ref 27.0–33.0)
MCHC: 33.1 g/dL (ref 32.0–36.0)
MCV: 89.1 fL (ref 80.0–100.0)
MPV: 10.2 fL (ref 7.5–12.5)
Monocytes Relative: 11.3 %
Neutro Abs: 5446 cells/uL (ref 1500–7800)
Neutrophils Relative %: 62.6 %
Platelets: 302 10*3/uL (ref 140–400)
RBC: 5.7 10*6/uL (ref 4.20–5.80)
RDW: 12.4 % (ref 11.0–15.0)
Total Lymphocyte: 24.2 %
WBC: 8.7 10*3/uL (ref 3.8–10.8)

## 2018-10-16 LAB — QUANTIFERON-TB GOLD PLUS
Mitogen-NIL: 8.49 IU/mL
NIL: 0.02 IU/mL
QuantiFERON-TB Gold Plus: NEGATIVE
TB1-NIL: 0 IU/mL
TB2-NIL: 0.01 IU/mL

## 2018-10-16 LAB — COMPLETE METABOLIC PANEL WITH GFR
AG Ratio: 1.8 (calc) (ref 1.0–2.5)
ALT: 13 U/L (ref 9–46)
AST: 17 U/L (ref 10–40)
Albumin: 4.5 g/dL (ref 3.6–5.1)
Alkaline phosphatase (APISO): 71 U/L (ref 36–130)
BUN: 13 mg/dL (ref 7–25)
CO2: 25 mmol/L (ref 20–32)
Calcium: 9.8 mg/dL (ref 8.6–10.3)
Chloride: 104 mmol/L (ref 98–110)
Creat: 0.97 mg/dL (ref 0.60–1.35)
GFR, Est African American: 123 mL/min/{1.73_m2} (ref >=60)
GFR, Est Non African American: 107 mL/min/{1.73_m2} (ref >=60)
Globulin: 2.5 g/dL (calc) (ref 1.9–3.7)
Glucose, Bld: 85 mg/dL (ref 65–139)
Potassium: 4.3 mmol/L (ref 3.5–5.3)
Sodium: 140 mmol/L (ref 135–146)
Total Bilirubin: 0.8 mg/dL (ref 0.2–1.2)
Total Protein: 7 g/dL (ref 6.1–8.1)

## 2018-10-16 LAB — HEMOGLOBIN A1C
Hgb A1c MFr Bld: 5 % of total Hgb (ref <5.7)
Mean Plasma Glucose: 97 (calc)
eAG (mmol/L): 5.4 (calc)

## 2018-10-16 LAB — C-REACTIVE PROTEIN: CRP: 2.4 mg/L (ref <8.0)

## 2018-10-16 LAB — SEDIMENTATION RATE: Sed Rate: 2 mm/h (ref 0–15)

## 2018-10-16 LAB — BRAIN NATRIURETIC PEPTIDE: Brain Natriuretic Peptide: 12 pg/mL (ref <100)

## 2018-10-16 LAB — TSH+FREE T4: TSH W/REFLEX TO FT4: 2.31 mIU/L (ref 0.40–4.50)

## 2018-10-25 ENCOUNTER — Encounter: Payer: Self-pay | Admitting: Physician Assistant

## 2018-10-25 DIAGNOSIS — Z9189 Other specified personal risk factors, not elsewhere classified: Secondary | ICD-10-CM | POA: Insufficient documentation

## 2018-10-25 DIAGNOSIS — R0683 Snoring: Secondary | ICD-10-CM | POA: Insufficient documentation

## 2018-12-14 ENCOUNTER — Other Ambulatory Visit: Payer: Self-pay

## 2018-12-14 ENCOUNTER — Ambulatory Visit: Payer: BLUE CROSS/BLUE SHIELD | Admitting: Physician Assistant

## 2018-12-14 VITALS — BP 119/64 | HR 73 | Ht 61.0 in | Wt 220.0 lb

## 2018-12-14 DIAGNOSIS — R0602 Shortness of breath: Secondary | ICD-10-CM

## 2018-12-14 DIAGNOSIS — Z008 Encounter for other general examination: Secondary | ICD-10-CM

## 2018-12-14 DIAGNOSIS — R0600 Dyspnea, unspecified: Secondary | ICD-10-CM | POA: Diagnosis not present

## 2018-12-14 DIAGNOSIS — R0609 Other forms of dyspnea: Secondary | ICD-10-CM

## 2018-12-14 MED ORDER — ALBUTEROL SULFATE HFA 108 (90 BASE) MCG/ACT IN AERS
4.0000 | INHALATION_SPRAY | Freq: Once | RESPIRATORY_TRACT | Status: AC
  Administered 2018-12-14: 15:00:00 4 via RESPIRATORY_TRACT

## 2018-12-16 ENCOUNTER — Telehealth (INDEPENDENT_AMBULATORY_CARE_PROVIDER_SITE_OTHER): Payer: BLUE CROSS/BLUE SHIELD | Admitting: Physician Assistant

## 2018-12-16 ENCOUNTER — Encounter: Payer: Self-pay | Admitting: Physician Assistant

## 2018-12-16 VITALS — Temp 98.3°F | Ht 61.0 in | Wt 220.0 lb

## 2018-12-16 DIAGNOSIS — F331 Major depressive disorder, recurrent, moderate: Secondary | ICD-10-CM

## 2018-12-16 DIAGNOSIS — F411 Generalized anxiety disorder: Secondary | ICD-10-CM

## 2018-12-16 DIAGNOSIS — F410 Panic disorder [episodic paroxysmal anxiety] without agoraphobia: Secondary | ICD-10-CM | POA: Diagnosis not present

## 2018-12-16 DIAGNOSIS — F5089 Other specified eating disorder: Secondary | ICD-10-CM | POA: Diagnosis not present

## 2018-12-16 MED ORDER — ALPRAZOLAM 0.25 MG PO TABS: 0.2500 mg | ORAL_TABLET | Freq: Once | ORAL | 1 refills | Status: AC | PRN

## 2018-12-16 MED ORDER — ONDANSETRON 4 MG PO TBDP: 4.0000 mg | ORAL_TABLET | Freq: Three times a day (TID) | ORAL | 0 refills | Status: DC | PRN

## 2018-12-16 MED ORDER — ESCITALOPRAM OXALATE 5 MG PO TABS: 15.0000 mg | ORAL_TABLET | Freq: Every evening | ORAL | 0 refills | Status: DC

## 2018-12-16 NOTE — Progress Notes (Signed)
HPI:                                                                Peter Mccullough is a 28 y.o. adult who presents to [facility]: [place] today for [place] reports long-standing, chronic "breathing issues" for most of his life, but gradually worsening over the last 3 years. States "I feel like I'm drowning in my lungs."  Worse mainly with exertion and hot temperatures and seems to be worse in the spring season. 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 Endorses wheezing and intermittent dry cough and rare sharp retrosternal chest pain that radiates to the right shoulder Endorses chronic secondhand smoke exposure for 20 years  Pertinent family hx - Mother had sarcoidosis  Work-up 10/14/18 - CXR negative Quantiferon gold, BNP, TSH, CRP, ESR unremarkable CBC showed slightly increased absolute monocytes (983)  Treatments tried Symbicort 80-4.5 2 puffs bid - has been helpful   Past Medical History:  Diagnosis Date  . Anxiety   . Gender dysphoria   . Obesity    Past Surgical History:  Procedure Laterality Date  . NO PAST SURGERIES     Social History   Tobacco Use  . Smoking status: Passive Smoke Exposure - Never Smoker  . Smokeless tobacco: Never Used  Substance Use Topics  . Alcohol use: Not Currently    Comment: 1 drink/year   family history includes COPD in his maternal uncle; Sarcoidosis in his mother.    ROS: negative except as noted in the HPI  Medications: Current Outpatient Medications  Medication Sig Dispense Refill  . budesonide-formoterol (SYMBICORT) 80-4.5 MCG/ACT inhaler Inhale 2 puffs into the lungs 2 (two) times a day. 10.2 Inhaler 3  . Melatonin 5 MG CAPS Take by mouth.    . Multiple Vitamin (MULTIVITAMIN) capsule Take 1 capsule by mouth daily.    Marland Kitchen NEEDLE, DISP, 25 G (BD  HYPODERMIC NEEDLE) 25G X 1-1/2" MISC For testosterone Injection    . SYRINGE-NEEDLE, DISP, 3 ML (B-D 3CC LUER-LOK SYR 18GX1-1/2) 18G X 1-1/2" 3 ML MISC For drawing Testosterone    . Testosterone Cypionate 200 MG/ML SOLN Inject 120 mg into the muscle every 14 (fourteen) days.    . ALPRAZolam (XANAX) 0.25 MG tablet Take 1 tablet (0.25 mg total) by mouth once as needed for up to 1 dose for anxiety. 20 tablet 1  . Biotin 1 MG CAPS Take 1 capsule by mouth daily.    Marland Kitchen escitalopram (LEXAPRO) 5 MG tablet Take 3 tablets (15 mg total) by mouth every evening. 270 tablet 0  . ondansetron (ZOFRAN-ODT) 4 MG disintegrating tablet Take 1 tablet (4 mg total) by mouth every 8 (eight) hours as needed for nausea or vomiting. 10 tablet 0   No current facility-administered medications for this visit.    No Known Allergies     Objective:  BP 119/64   Pulse 73   Ht '5\' 1"'  (1.549 m)   Wt 220 lb (99.8 kg)   SpO2 98%   PF (P) 409 L/min   BMI 41.57 kg/m    Recent Results (from the past  2160 hour(s))  CBC with Differential/Platelet     Status: Abnormal   Collection Time: 10/14/18  4:08 PM  Result Value Ref Range   WBC 8.7 3.8 - 10.8 Thousand/uL   RBC 5.70 4.20 - 5.80 Million/uL   Hemoglobin 16.8 13.2 - 17.1 g/dL   HCT 50.8 (H) 38.5 - 50.0 %   MCV 89.1 80.0 - 100.0 fL   MCH 29.5 27.0 - 33.0 pg   MCHC 33.1 32.0 - 36.0 g/dL   RDW 12.4 11.0 - 15.0 %   Platelets 302 140 - 400 Thousand/uL   MPV 10.2 7.5 - 12.5 fL   Neutro Abs 5,446 1,500 - 7,800 cells/uL   Lymphs Abs 2,105 850 - 3,900 cells/uL   Absolute Monocytes 983 (H) 200 - 950 cells/uL   Eosinophils Absolute 122 15 - 500 cells/uL   Basophils Absolute 44 0 - 200 cells/uL   Neutrophils Relative % 62.6 %   Total Lymphocyte 24.2 %   Monocytes Relative 11.3 %   Eosinophils Relative 1.4 %   Basophils Relative 0.5 %  COMPLETE METABOLIC PANEL WITH GFR     Status: None   Collection Time: 10/14/18  4:08 PM  Result Value Ref Range   Glucose, Bld 85 65 -  139 mg/dL    Comment: .        Non-fasting reference interval .    BUN 13 7 - 25 mg/dL   Creat 0.97 0.60 - 1.35 mg/dL   GFR, Est Non African American 107 > OR = 60 mL/min/1.79m   GFR, Est African American 123 > OR = 60 mL/min/1.740m  BUN/Creatinine Ratio NOT APPLICABLE 6 - 22 (calc)   Sodium 140 135 - 146 mmol/L   Potassium 4.3 3.5 - 5.3 mmol/L   Chloride 104 98 - 110 mmol/L   CO2 25 20 - 32 mmol/L   Calcium 9.8 8.6 - 10.3 mg/dL   Total Protein 7.0 6.1 - 8.1 g/dL   Albumin 4.5 3.6 - 5.1 g/dL   Globulin 2.5 1.9 - 3.7 g/dL (calc)   AG Ratio 1.8 1.0 - 2.5 (calc)   Total Bilirubin 0.8 0.2 - 1.2 mg/dL   Alkaline phosphatase (APISO) 71 36 - 130 U/L   AST 17 10 - 40 U/L   ALT 13 9 - 46 U/L  Hemoglobin A1c     Status: None   Collection Time: 10/14/18  4:08 PM  Result Value Ref Range   Hgb A1c MFr Bld 5.0 <5.7 % of total Hgb    Comment: For the purpose of screening for the presence of diabetes: . <5.7%       Consistent with the absence of diabetes 5.7-6.4%    Consistent with increased risk for diabetes             (prediabetes) > or =6.5%  Consistent with diabetes . This assay result is consistent with a decreased risk of diabetes. . Currently, no consensus exists regarding use of hemoglobin A1c for diagnosis of diabetes in children. . According to American Diabetes Association (ADA) guidelines, hemoglobin A1c <7.0% represents optimal control in non-pregnant diabetic patients. Different metrics may apply to specific patient populations.  Standards of Medical Care in Diabetes(ADA). .    Mean Plasma Glucose 97 (calc)   eAG (mmol/L) 5.4 (calc)  Sedimentation rate     Status: None   Collection Time: 10/14/18  4:08 PM  Result Value Ref Range   Sed Rate 2 0 - 15 mm/h  C-reactive protein  Status: None   Collection Time: 10/14/18  4:08 PM  Result Value Ref Range   CRP 2.4 <8.0 mg/L  TSH + free T4     Status: None   Collection Time: 10/14/18  4:08 PM  Result Value Ref  Range   TSH W/REFLEX TO FT4 2.31 0.40 - 4.50 mIU/L  B Nat Peptide     Status: None   Collection Time: 10/14/18  4:08 PM  Result Value Ref Range   Brain Natriuretic Peptide 12 <100 pg/mL    Comment: . BNP levels increase with age in the general population with the highest values seen in individuals greater than 66 years of age. Reference: J. Am. [place] Ar. Cardiol. 2002; 82:641-583. Ronney Asters Plus     Status: None   Collection Time: 10/14/18  4:08 PM  Result Value Ref Range   QuantiFERON-TB Gold Plus NEGATIVE NEGATIVE    Comment: Negative test result. M. tuberculosis complex  infection unlikely.    NIL 0.02 IU/mL   Mitogen-NIL 8.49 IU/mL   TB1-NIL 0.00 IU/mL   TB2-NIL 0.01 IU/mL    Comment: . The Nil tube value reflects the background interferon gamma immune response of the patient's blood sample. This value has been subtracted from the patient's displayed TB and Mitogen results. . Lower than expected results with the Mitogen tube prevent false-negative Quantiferon readings by detecting a patient with a potential immune suppressive condition and/or suboptimal pre-analytical specimen handling. . The TB1 Antigen tube is coated with the M. tuberculosis-specific antigens designed to elicit responses from TB antigen primed CD4+ helper T-lymphocytes. . The TB2 Antigen tube is coated with the M. tuberculosis-specific antigens designed to elicit responses from TB antigen primed CD4+ helper and CD8+ cytotoxic T-lymphocytes. . For additional information, please refer to https://education.questdiagnostics.com/faq/FAQ204 (This link is being provided for informational/ educational purposes only.) .     Office Spirometry Results: Peak Flow: (P) 409 L/min FEV1: (P) 3.13 liters FVC: (P) 3.51 liters FEV1/FVC: (P) 89.2 % FeF 25-75: (P) 4.49 liters   No results found for this or any previous visit (from the past 72 hour(s)). No results found.    Assessment and  Plan: 28 y.o. adult with   .[place] was seen today for shortness of breath.  Diagnoses and all orders for this visit:  Encounter for pulmonary function testing  Shortness of breath -     PR EVAL OF BRONCHOSPASM -     albuterol (VENTOLIN HFA) 108 (90 Base) MCG/ACT inhaler 4 puff  Chronic dyspnea  No evidence of obstruction Essentially normal spirometry in office today, however maneuvers were not reproducible FEV1 106% predicted (post-test) There was no significant change in FVC or FEV1 post-bronchodilator Peak flow improved from 327 to 409 post-bronchodilator I still suspect there is underlying reactive airway disease  Recommend continuing low-dose Symbicort     Patient education and anticipatory guidance given Patient agrees with treatment plan Follow-up every 6 months or sooner as needed if symptoms worsen or fail to improve  Darlyne Russian PA-C

## 2018-12-16 NOTE — Progress Notes (Signed)
Virtual Visit via Video Note  I connected with Peter Mccullough on 12/25/18 at 10:10 AM EDT by a video enabled telemedicine application and verified that I am speaking with the correct person using two identifiers.   I discussed the limitations of evaluation and management by telemedicine and the availability of in person appointments. The patient expressed understanding and agreed to proceed.  History of Present Illness: HPI:                                                                Peter Mccullough is a 28 y.o. adult   CC: anxiety follow-up  Reports increased anxiety symptoms and panic attacks over the last month. Has had to call out of work due to panic attacks causing severe nausea and vomiting  Has been using Alprazolam several days per week. Reports excess sedation with 0.5 mg dose, but 0.25 mg is effective Sleep: reports he alternates between sleeping too much and too little (4-12 hours per night). Notes that he sleeps more when he feels depressed. Sleeps less on work nights  In the past 2 weeks he has had 2 episodes of passive SI. Describes feeling like he "would want something to strike him down," but he also does not wish to self harm Stressors: work, health, COVID-19 pandemic Current meds: Lexapro 10 mg, Alprazolam 0.25 mg  [facility] 12/16/2018 10/14/2018 09/20/2018  Decreased Interest 1 1 1   Down, Depressed, Hopeless 2 1 2   PHQ - 2 Score 3 2 3   Altered sleeping 2 2 3   Tired, decreased energy 3 2 2   Change in appetite 0 0 1  Feeling bad or failure about yourself  3 1 2   Trouble concentrating 0 1 2  Moving slowly or fidgety/restless 0 1 1  Suicidal thoughts 1 1 0  PHQ-9 Score 12 10 14   Difficult doing work/chores Somewhat difficult Somewhat difficult -    GAD 7 : Generalized Anxiety Score 12/16/2018 10/14/2018 09/20/2018  Nervous, Anxious, on Edge 2 1 2   Control/stop worrying 2 1 1   Worry too much - different things 2 1 2   Trouble relaxing 1 1 2   Restless 1 1 1    Easily annoyed or irritable 1 1 2   Afraid - awful might happen 1 2 2   Total GAD 7 Score 10 8 12   Anxiety Difficulty Somewhat difficult - -      Past Medical History:  Diagnosis Date  . Anxiety   . Gender dysphoria   . Obesity    Past Surgical History:  Procedure Laterality Date  . NO PAST SURGERIES     Social History   Tobacco Use  . Smoking status: Passive Smoke Exposure - Never Smoker  . Smokeless tobacco: Never Used  Substance Use Topics  . Alcohol use: Not Currently    Comment: 1 drink/year   family history includes COPD in his maternal uncle; Sarcoidosis in his mother.    ROS: negative except as noted in the HPI  Medications: Current Outpatient Medications  Medication Sig Dispense Refill  . ALPRAZolam (XANAX) 0.25 MG tablet Take 1 tablet (0.25 mg total) by mouth once as needed for up to 1 dose for anxiety. 20 tablet 1  . Biotin 1 MG CAPS Take 1 capsule by mouth daily.    .Marland Kitchen  budesonide-formoterol (SYMBICORT) 80-4.5 MCG/ACT inhaler Inhale 2 puffs into the lungs 2 (two) times a day. 10.2 Inhaler 3  . escitalopram (LEXAPRO) 5 MG tablet Take 3 tablets (15 mg total) by mouth every evening. 270 tablet 0  . Melatonin 5 MG CAPS Take by mouth.    . Multiple Vitamin (MULTIVITAMIN) capsule Take 1 capsule by mouth daily.    Marland Kitchen NEEDLE, DISP, 25 G (BD HYPODERMIC NEEDLE) 25G X 1-1/2" MISC For testosterone Injection    . SYRINGE-NEEDLE, DISP, 3 ML (B-D 3CC LUER-LOK SYR 18GX1-1/2) 18G X 1-1/2" 3 ML MISC For drawing Testosterone    . Testosterone Cypionate 200 MG/ML SOLN Inject 120 mg into the muscle every 14 (fourteen) days.    . ondansetron (ZOFRAN-ODT) 4 MG disintegrating tablet Take 1 tablet (4 mg total) by mouth every 8 (eight) hours as needed for nausea or vomiting. 10 tablet 0   No current facility-administered medications for this visit.    No Known Allergies     Objective:  Temp 98.3 F (36.8 C) (Oral)   Ht 5\' 1"  (1.549 m)   Wt 220 lb (99.8 kg)   BMI 41.57 kg/m   Gen:  alert, not ill-appearing, no distress, appropriate for age 28: head normocephalic without obvious abnormality, conjunctiva and cornea clear, trachea midline Pulm: Normal work of breathing, normal phonation Neuro: alert and oriented x 3 Psych: cooperative, depressedmood, affect mood-congruent, speech is articulate, normal rate and volume; thought processes clear and goal-directed, normal judgment, good insight, passive SI, no HI    No results found for this or any previous visit (from the past 72 hour(s)). No results found.    Assessment and Plan: 28 y.o. adult with   .[name] was seen today for follow-up.  Diagnoses and all orders for this visit:  Psychogenic vomiting with nausea -     ondansetron (ZOFRAN-ODT) 4 MG disintegrating tablet; Take 1 tablet (4 mg total) by mouth every 8 (eight) hours as needed for nausea or vomiting.  Generalized anxiety disorder with panic attacks -     ALPRAZolam (XANAX) 0.25 MG tablet; Take 1 tablet (0.25 mg total) by mouth once as needed for up to 1 dose for anxiety. -     escitalopram (LEXAPRO) 5 MG tablet; Take 3 tablets (15 mg total) by mouth every evening.  Moderate episode of recurrent major depressive disorder (HCC) -     escitalopram (LEXAPRO) 5 MG tablet; Take 3 tablets (15 mg total) by mouth every evening.   PHQ9=12, passive SI, no acute safety issues, patient verbally contracted for safety and safety plan reviewed Increasing lexapro to 15 mg daily Zofran prn for nausea/vomting - counseled to trial taking in the evening/early morning before work to prevent am nausea/vomiting due to anxiety Cont low-dose Alprazolam prn panic attacks - did not tolerate 0.5 mg Work note provided for recent absence   Follow-up in 2 weeks or sooner as needed if symptoms worsen or fail to improve  Follow Up Instructions:    I discussed the assessment and treatment plan with the patient. The patient was provided an opportunity to ask questions and  all were answered. The patient agreed with the plan and demonstrated an understanding of the instructions.   The patient was advised to call back or seek an in-person evaluation if the symptoms worsen or if the condition fails to improve as anticipated.  I provided 10 minutes of non-face-to-face time during this encounter.   Trixie Dredge, [place]

## 2018-12-25 ENCOUNTER — Encounter: Payer: Self-pay | Admitting: Physician Assistant

## 2018-12-30 ENCOUNTER — Telehealth (INDEPENDENT_AMBULATORY_CARE_PROVIDER_SITE_OTHER): Payer: BLUE CROSS/BLUE SHIELD | Admitting: Physician Assistant

## 2018-12-30 VITALS — Wt 218.0 lb

## 2018-12-30 DIAGNOSIS — F411 Generalized anxiety disorder: Secondary | ICD-10-CM

## 2018-12-30 DIAGNOSIS — F331 Major depressive disorder, recurrent, moderate: Secondary | ICD-10-CM

## 2018-12-30 DIAGNOSIS — Z9189 Other specified personal risk factors, not elsewhere classified: Secondary | ICD-10-CM | POA: Diagnosis not present

## 2018-12-30 DIAGNOSIS — F419 Anxiety disorder, unspecified: Secondary | ICD-10-CM

## 2018-12-30 DIAGNOSIS — R0600 Dyspnea, unspecified: Secondary | ICD-10-CM

## 2018-12-30 DIAGNOSIS — R0609 Other forms of dyspnea: Secondary | ICD-10-CM

## 2018-12-30 DIAGNOSIS — Z6841 Body Mass Index (BMI) 40.0 and over, adult: Secondary | ICD-10-CM

## 2018-12-30 DIAGNOSIS — F5105 Insomnia due to other mental disorder: Secondary | ICD-10-CM

## 2018-12-30 DIAGNOSIS — F410 Panic disorder [episodic paroxysmal anxiety] without agoraphobia: Secondary | ICD-10-CM

## 2018-12-30 MED ORDER — HYDROXYZINE HCL 10 MG PO TABS: 10.0000 mg | ORAL_TABLET | Freq: Every evening | ORAL | 0 refills | Status: AC | PRN

## 2018-12-30 NOTE — Progress Notes (Signed)
Virtual Visit via Video Note  I connected with Peter LinkSalem Brummell on 12/30/18 at 11:10 AM EDT by a video enabled telemedicine application and verified that I am speaking with the correct person using two identifiers.   I discussed the limitations of evaluation and management by telemedicine and the availability of in person appointments. The patient expressed understanding and agreed to proceed.  History of Present Illness: HPI:                                                                Peter Mccullough is a 28 y.o. adult   CC: anxiety/depression follow-up  Lexapro was increased to 15 mg 2 weeks ago due to increased panic attacks He was also started on Zofran for cyclic vomiting Reports this has been working well for him and has noticed fewer suicidal thoughts and ability to cope with conflicts better. He has taken Alprazolam on 2 occasions with improvement of anxiety Reports he has only needed the nausea medicine one time and it was helpful Has not missed any additional days of work He reports he has felt anxious about losing weight in advance of upcoming top surgery on October 6.   Reports he has fallen asleep at work on several occasions over the last 2 weeks. Has been told that he snores occasionally. Sleeping partner does not report any witnessed apnea. Endorses hypersomnia despite adequate sleep. Results of the Epworth flowsheet 12/30/2018  Sitting and reading 0  Watching TV 1  Sitting, inactive in a public place (e.g. a theatre or a meeting) 1  As a passenger in a car for an hour without a break 2  Lying down to rest in the afternoon when circumstances permit 3  Sitting and talking to someone 0  Sitting quietly after a lunch without alcohol 1  In a car, while stopped for a few minutes in traffic 0  Total score 8    He continues to have DOE with activity at work. Breathing is worse with strenuous activity like pushing pallets that weigh hundreds of lb will produce symptoms.  He states  it takes him 5-10 minutes of resting to catch his breathe.  He also states when he was going to the gym in January he could do about 30-40 minutes of a brisk walk without symptoms.   [facility] 12/30/2018 12/16/2018 10/14/2018 09/20/2018  Decreased Interest 1 1 1 1   Down, Depressed, Hopeless 2 2 1 2   PHQ - 2 Score 3 3 2 3   Altered sleeping 2 2 2 3   Tired, decreased energy 2 3 2 2   Change in appetite 0 0 0 1  Feeling bad or failure about yourself  1 3 1 2   Trouble concentrating 1 0 1 2  Moving slowly or fidgety/restless 0 0 1 1  Suicidal thoughts 0 1 1 0  PHQ-9 Score 9 12 10 14   Difficult doing work/chores - Somewhat difficult Somewhat difficult -    GAD 7 : Generalized Anxiety Score 12/30/2018 12/16/2018 10/14/2018 09/20/2018  Nervous, Anxious, on Edge 2 2 1 2   Control/stop worrying 2 2 1 1   Worry too much - different things 2 2 1 2   Trouble relaxing 1 1 1 2   Restless 1 1 1 1   Easily annoyed or  irritable 0 1 1 2   Afraid - awful might happen 0 1 2 2   Total GAD 7 Score 8 10 8 12   Anxiety Difficulty - Somewhat difficult - -      Past Medical History:  Diagnosis Date  . Anxiety   . Gender dysphoria   . Obesity    Past Surgical History:  Procedure Laterality Date  . NO PAST SURGERIES     Social History   Tobacco Use  . Smoking status: Passive Smoke Exposure - Never Smoker  . Smokeless tobacco: Never Used  Substance Use Topics  . Alcohol use: Not Currently    Comment: 1 drink/year   family history includes COPD in his maternal uncle; Sarcoidosis in his mother.    ROS: negative except as noted in the HPI  Medications: Current Outpatient Medications  Medication Sig Dispense Refill  . ALPRAZolam (XANAX) 0.25 MG tablet Take 1 tablet (0.25 mg total) by mouth once as needed for up to 1 dose for anxiety. 20 tablet 1  . Biotin 1 MG CAPS Take 1 capsule by mouth daily.    . budesonide-formoterol (SYMBICORT) 80-4.5 MCG/ACT inhaler Inhale 2 puffs into the lungs 2  (two) times a day. 10.2 Inhaler 3  . escitalopram (LEXAPRO) 5 MG tablet Take 3 tablets (15 mg total) by mouth every evening. 270 tablet 0  . Melatonin 5 MG CAPS Take by mouth.    . Multiple Vitamin (MULTIVITAMIN) capsule Take 1 capsule by mouth daily.    Marland Kitchen NEEDLE, DISP, 25 G (BD HYPODERMIC NEEDLE) 25G X 1-1/2" MISC For testosterone Injection    . ondansetron (ZOFRAN-ODT) 4 MG disintegrating tablet Take 1 tablet (4 mg total) by mouth every 8 (eight) hours as needed for nausea or vomiting. 10 tablet 0  . SYRINGE-NEEDLE, DISP, 3 ML (B-D 3CC LUER-LOK SYR 18GX1-1/2) 18G X 1-1/2" 3 ML MISC For drawing Testosterone    . Testosterone Cypionate 200 MG/ML SOLN Inject 120 mg into the muscle every 14 (fourteen) days.     No current facility-administered medications for this visit.    No Known Allergies     Objective:  Wt 218 lb (98.9 kg)   BMI 41.19 kg/m  Neuro: alert and oriented x 3 Psych: cooperative, depressed mood, affect mood-congruent, speech is articulate, normal rate and volume; thought processes clear and goal-directed, normal judgment, good insight    No results found for this or any previous visit (from the past 72 hour(s)). No results found.    Assessment and Plan: 28 y.o. adult with   .Tayven was seen today for medication management.  Diagnoses and all orders for this visit:  Generalized anxiety disorder with panic attacks  Moderate episode of recurrent major depressive disorder (HCC)  Chronic dyspnea -     ECHOCARDIOGRAM COMPLETE; Future  At risk for obstructive sleep apnea -     Home sleep test  Hyposomnia, insomnia or sleeplessness associated with anxiety -     hydrOXYzine (ATARAX/VISTARIL) 10 MG tablet; Take 1-5 tablets (10-50 mg total) by mouth at bedtime as needed for anxiety (insomnia). Gradually increase by 1 tablet every 3 nights as needed for sleep. Max dose 50 mg  Class 3 severe obesity due to excess calories with body mass index (BMI) of 40.0 to 44.9 in  adult, unspecified whether serious comorbidity present ([place]) -     Amb ref to Medical Nutrition Therapy-MNT   At risk for OSA ESS=8 Positive STOPBANG - snoring, excessive daytime sleepiness, BMI>35, testosterone supplementation Home sleep study  pending  DOE Negative CXR 10/14/18 Normal spirometry last month Symptoms persist despite empiric treatment with Symbicort Family hx of sarcoidosis in mother Echo pending  GAD, Depression   [facility] [place]  PHQ-9 Total Score  9    No acute safety issues GAD7=8, slightly improved from 10 Cont Lexapro 15 mg for full month and consider maximizing dose if needed Adding Hydroxyzine for insomnia/nighttime anxiety  Follow-up in 2 weeks or sooner if needed  Follow Up Instructions:    I discussed the assessment and treatment plan with the patient. The patient was provided an opportunity to ask questions and all were answered. The patient agreed with the plan and demonstrated an understanding of the instructions.   The patient was advised to call back or seek an in-person evaluation if the symptoms worsen or if the condition fails to improve as anticipated.  I provided 15 minutes of non-face-to-face time during this encounter.   Carlis Stableharley Elizabeth Adalie Mand, [place]PA-C

## 2019-01-07 ENCOUNTER — Other Ambulatory Visit (HOSPITAL_COMMUNITY): Payer: BLUE CROSS/BLUE SHIELD

## 2019-01-08 ENCOUNTER — Other Ambulatory Visit: Payer: Self-pay | Admitting: Physician Assistant

## 2019-01-08 DIAGNOSIS — F410 Panic disorder [episodic paroxysmal anxiety] without agoraphobia: Secondary | ICD-10-CM

## 2019-01-08 DIAGNOSIS — F411 Generalized anxiety disorder: Secondary | ICD-10-CM

## 2019-01-08 DIAGNOSIS — F331 Major depressive disorder, recurrent, moderate: Secondary | ICD-10-CM

## 2019-01-09 ENCOUNTER — Other Ambulatory Visit: Payer: Self-pay | Admitting: Physician Assistant

## 2019-01-09 DIAGNOSIS — R0609 Other forms of dyspnea: Secondary | ICD-10-CM

## 2019-01-11 ENCOUNTER — Encounter: Payer: Self-pay | Admitting: Physician Assistant

## 2019-01-11 ENCOUNTER — Telehealth: Payer: Self-pay

## 2019-01-11 ENCOUNTER — Telehealth (INDEPENDENT_AMBULATORY_CARE_PROVIDER_SITE_OTHER): Payer: BLUE CROSS/BLUE SHIELD | Admitting: Physician Assistant

## 2019-01-11 VITALS — Temp 97.3°F | Wt 218.0 lb

## 2019-01-11 DIAGNOSIS — R0600 Dyspnea, unspecified: Secondary | ICD-10-CM

## 2019-01-11 DIAGNOSIS — F324 Major depressive disorder, single episode, in partial remission: Secondary | ICD-10-CM | POA: Insufficient documentation

## 2019-01-11 DIAGNOSIS — F411 Generalized anxiety disorder: Secondary | ICD-10-CM | POA: Diagnosis not present

## 2019-01-11 DIAGNOSIS — R0609 Other forms of dyspnea: Secondary | ICD-10-CM

## 2019-01-11 DIAGNOSIS — R45851 Suicidal ideations: Secondary | ICD-10-CM

## 2019-01-11 DIAGNOSIS — Z6841 Body Mass Index (BMI) 40.0 and over, adult: Secondary | ICD-10-CM

## 2019-01-11 DIAGNOSIS — F3341 Major depressive disorder, recurrent, in partial remission: Secondary | ICD-10-CM

## 2019-01-11 DIAGNOSIS — F410 Panic disorder [episodic paroxysmal anxiety] without agoraphobia: Secondary | ICD-10-CM

## 2019-01-11 MED ORDER — LOMAIRA 8 MG PO TABS: 8.0000 mg | ORAL_TABLET | Freq: Every day | ORAL | 0 refills | Status: DC

## 2019-01-11 NOTE — Progress Notes (Signed)
Virtual Visit via Telephone Note  I connected with Peter Mccullough on 01/11/19 at 11:10 AM EDT by telephone and verified that I am speaking with the correct person using two identifiers.   I discussed the limitations, risks, security and privacy concerns of performing an evaluation and management service by telephone and the availability of in person appointments. I also discussed with the patient that there may be a patient responsible charge related to this service. The patient expressed understanding and agreed to proceed.   History of Present Illness: HPI:                                                                Peter Mccullough is a 28 y.o. adult   CC: anxiety/insomnia/dyspnea follow-up  Mood: tired SI: had one day where he felt like if something happened to him he would not care, denies active SI or self-harm Sleep: reports he has tried up to 20 mg of Hydroxyzine as needed and it is helpful. He avoids taking it on days where he has to get up early Anxiety: reports he is handling things a lot better. Has had 1 panic attack since last visit and had to call in to work. Reports there was a conflict with his uncle that triggered this Current meds: Lexapro 15 mg nightly, Hydroxyzine 20 mg QHS prn, Alprazolam 0.25 mg Therapist: Dr. Hazeline Junkeravid Mccullough, has not had an appointment in several months. Reports he was uncomfortable with video visits, but he is going to see if he can schedule a phone visit  Chronic dyspnea: he trialed not using Symbicort inhaler for about a week and noted worsening symptoms including recurrent productive cough. He re-started Symbicort last Thursday and symptoms are gradually improving. Echo is scheduled for tomorrow He has not been contacted about sleep study  Weight: he is interested in medication to help him lose weight before gender affirming top surgery in October. Pre-op is scheduled for this week. He has been dieting and skipping lunch to reduce daily calorie  intake.  [facility] 01/11/2019 12/30/2018 12/16/2018 10/14/2018 09/20/2018  Decreased Interest 1 1 1 1 1   Down, Depressed, Hopeless 1 2 2 1 2   PHQ - 2 Score 2 3 3 2 3   Altered sleeping 2 2 2 2 3   Tired, decreased energy 2 2 3 2 2   Change in appetite 0 0 0 0 1  Feeling bad or failure about yourself  1 1 3 1 2   Trouble concentrating 0 1 0 1 2  Moving slowly or fidgety/restless 0 0 0 1 1  Suicidal thoughts 0 0 1 1 0  PHQ-9 Score 7 9 12 10 14   Difficult doing work/chores - - Somewhat difficult Somewhat difficult -    GAD 7 : Generalized Anxiety Score 01/11/2019 12/30/2018 12/16/2018 10/14/2018  Nervous, Anxious, on Edge 1 2 2 1   Control/stop worrying 1 2 2 1   Worry too much - different things 1 2 2 1   Trouble relaxing 2 1 1 1   Restless 0 1 1 1   Easily annoyed or irritable 0 0 1 1  Afraid - awful might happen 0 0 1 2  Total GAD 7 Score 5 8 10 8   Anxiety Difficulty - - Somewhat difficult -  Past Medical History:  Diagnosis Date  . Anxiety   . Gender dysphoria   . Obesity    Past Surgical History:  Procedure Laterality Date  . NO PAST SURGERIES     Social History   Tobacco Use  . Smoking status: Passive Smoke Exposure - Never Smoker  . Smokeless tobacco: Never Used  Substance Use Topics  . Alcohol use: Not Currently    Comment: 1 drink/year   family history includes COPD in his maternal uncle; Sarcoidosis in his mother.    ROS: negative except as noted in the HPI  Medications: Current Outpatient Medications  Medication Sig Dispense Refill  . ALPRAZolam (XANAX) 0.25 MG tablet Take 1 tablet (0.25 mg total) by mouth once as needed for up to 1 dose for anxiety. 20 tablet 1  . Biotin 1 MG CAPS Take 1 capsule by mouth daily.    Marland Kitchen escitalopram (LEXAPRO) 5 MG tablet Take 3 tablets (15 mg total) by mouth every evening. 270 tablet 0  . hydrOXYzine (ATARAX/VISTARIL) 10 MG tablet Take 1-5 tablets (10-50 mg total) by mouth at bedtime as needed for anxiety  (insomnia). Gradually increase by 1 tablet every 3 nights as needed for sleep. Max dose 50 mg 40 tablet 0  . Multiple Vitamin (MULTIVITAMIN) capsule Take 1 capsule by mouth daily.    Marland Kitchen NEEDLE, DISP, 25 G (BD HYPODERMIC NEEDLE) 25G X 1-1/2" MISC For testosterone Injection    . ondansetron (ZOFRAN-ODT) 4 MG disintegrating tablet Take 1 tablet (4 mg total) by mouth every 8 (eight) hours as needed for nausea or vomiting. 10 tablet 0  . SYMBICORT 80-4.5 MCG/ACT inhaler TAKE 2 PUFFS BY MOUTH TWICE A DAY 30.6 Inhaler 1  . SYRINGE-NEEDLE, DISP, 3 ML (B-D 3CC LUER-LOK SYR 18GX1-1/2) 18G X 1-1/2" 3 ML MISC For drawing Testosterone    . Testosterone Cypionate 200 MG/ML SOLN Inject 120 mg into the muscle every 14 (fourteen) days.    . Melatonin 5 MG CAPS Take by mouth.     No current facility-administered medications for this visit.    No Known Allergies     Objective:  Temp (!) 97.3 F (36.3 C)   Wt Readings from Last 3 Encounters:  12/30/18 218 lb (98.9 kg)  12/16/18 220 lb (99.8 kg)  12/14/18 220 lb (99.8 kg)   Temp Readings from Last 3 Encounters:  01/11/19 (!) 97.3 F (36.3 C)  12/16/18 98.3 F (36.8 C) (Oral)  10/14/18 98.3 F (36.8 C) (Oral)   BP Readings from Last 3 Encounters:  12/14/18 119/64  10/14/18 121/66   Pulse Readings from Last 3 Encounters:  12/14/18 73  10/14/18 81    Pulm: Normal work of breathing, normal phonation Neuro: alert and oriented x 3 Psych: cooperative, "tired" mood, affect mood-congruent, speech is articulate, normal rate and volume; thought processes clear and goal-directed, normal judgment, good insight   No results found for this or any previous visit (from the past 72 hour(s)). No results found.    Assessment and Plan: 28 y.o. adult with  .Peter Mccullough was seen today for medication management.  Diagnoses and all orders for this visit:  Class 3 severe obesity due to excess calories with body mass index (BMI) of 40.0 to 44.9 in adult,  unspecified whether serious comorbidity present (HCC) -     Phentermine HCl (LOMAIRA) 8 MG TABS; Take 8 mg by mouth daily. Take 2 hours after breakfast/first meal of the day  Chronic dyspnea  Generalized anxiety disorder with panic attacks  Recurrent major depressive disorder, in partial remission ([place])  Passive suicidal ideations     GAD, Depression   Video Visit from 01/11/2019 in [facility]  PHQ-9 Total Score  7    No acute safety issues, chronic passive SI, verbally contracted for safety GAD7=5 Scores continue to improve Cont Lexapro 15 mg QHS.  Hydroxyzine 20 mg QHS prn, Alprazolam 0.25 mg Encouraged patient to schedule f/u with counselor   Obesity Wt Readings from Last 3 Encounters:  01/11/19 218 lb (98.9 kg)  12/30/18 218 lb (98.9 kg)  12/16/18 220 lb (99.8 kg)  Instructed patient to contact surgical team regarding use of Lomaira in the pre-op period before starting medication Due to SSRI use and panic disorder, will start with very lose dose 8 mg once daily with option to increase to bid  Counseled patient on potential AE and to self-discontinue and notify me for behavioral/mood changes, panic attack, diaphoresis/nausea/dizziness Counseled patient that he may use intermittent fasting and eat within a certain window, but to avoid meal skipping  Chronic Dyspnea Echo scheduled and pending Home sleep study ordered, re-printed requisition and gave to [place] to schedule  Follow-up in 3 weeks for weight check/anxiety   Follow Up Instructions:    I discussed the assessment and treatment plan with the patient. The patient was provided an opportunity to ask questions and all were answered. The patient agreed with the plan and demonstrated an understanding of the instructions.   The patient was advised to call back or seek an in-person evaluation if the symptoms worsen or if the condition fails to improve as anticipated.  I  provided 16 minutes of non-face-to-face time during this encounter.   Trixie Dredge, [place]

## 2019-01-12 ENCOUNTER — Ambulatory Visit (HOSPITAL_COMMUNITY): Payer: BLUE CROSS/BLUE SHIELD | Attending: Cardiovascular Disease

## 2019-01-12 ENCOUNTER — Other Ambulatory Visit: Payer: Self-pay

## 2019-01-12 DIAGNOSIS — R0600 Dyspnea, unspecified: Secondary | ICD-10-CM | POA: Diagnosis not present

## 2019-01-12 DIAGNOSIS — R0609 Other forms of dyspnea: Secondary | ICD-10-CM

## 2019-01-13 NOTE — Telephone Encounter (Signed)
Opened in error

## 2019-01-14 ENCOUNTER — Telehealth: Payer: Self-pay | Admitting: Cardiovascular Disease

## 2019-01-14 NOTE — Telephone Encounter (Signed)
Left detailed message for [name] with [facility]... re: Dr. Elmarie Shiley message. I have asked if she has any further questions please call our office back and I asked th PCC's to be put through directly to me.

## 2019-01-14 NOTE — Telephone Encounter (Signed)
New Message   [name] calling in from [place] needing more information and a addendum of the echocardiogram results. Nurse states that in the results it states that "The pericardial effusion is localized near the right ventricle" then lower in the results it states "There is no evidence of pericardial effusion. The pericardial effusion is localized near the right ventricle." The nurse is requesting that an addendum is made so that it is clear whether there is a pericardial effusion or not. Please call back to confirm. Detailed message can be left since nurse is working triage.

## 2019-01-14 NOTE — Telephone Encounter (Signed)
The information about any pericardial effusion was an accidental click on the far side of the data entry screen   There is no pericardial effusion Sorry for the confusion The report has been amended   Lelon Frohlich, Will you call the primary care office and inform them the report has been corrected.   Thanks  [employer]

## 2019-01-14 NOTE — Telephone Encounter (Signed)
I received your message, thank you for all your help!!

## 2019-01-28 ENCOUNTER — Telehealth (INDEPENDENT_AMBULATORY_CARE_PROVIDER_SITE_OTHER): Payer: BLUE CROSS/BLUE SHIELD | Admitting: Physician Assistant

## 2019-01-28 ENCOUNTER — Encounter: Payer: Self-pay | Admitting: Physician Assistant

## 2019-01-28 DIAGNOSIS — R45851 Suicidal ideations: Secondary | ICD-10-CM

## 2019-01-28 DIAGNOSIS — F640 Transsexualism: Secondary | ICD-10-CM

## 2019-01-28 DIAGNOSIS — Z5181 Encounter for therapeutic drug level monitoring: Secondary | ICD-10-CM

## 2019-01-28 DIAGNOSIS — F411 Generalized anxiety disorder: Secondary | ICD-10-CM | POA: Diagnosis not present

## 2019-01-28 DIAGNOSIS — F5089 Other specified eating disorder: Secondary | ICD-10-CM

## 2019-01-28 DIAGNOSIS — E349 Endocrine disorder, unspecified: Secondary | ICD-10-CM

## 2019-01-28 DIAGNOSIS — Z79890 Hormone replacement therapy (postmenopausal): Secondary | ICD-10-CM

## 2019-01-28 DIAGNOSIS — F418 Other specified anxiety disorders: Secondary | ICD-10-CM

## 2019-01-28 DIAGNOSIS — F410 Panic disorder [episodic paroxysmal anxiety] without agoraphobia: Secondary | ICD-10-CM

## 2019-01-28 DIAGNOSIS — IMO0001 Reserved for inherently not codable concepts without codable children: Secondary | ICD-10-CM

## 2019-01-28 MED ORDER — ONDANSETRON 4 MG PO TBDP: 4.0000 mg | ORAL_TABLET | Freq: Three times a day (TID) | ORAL | 0 refills | Status: AC | PRN

## 2019-01-28 NOTE — Progress Notes (Signed)
Virtual Visit via Telephone Note  I connected with Peter LinkSalem Prestage on 01/28/19 at  9:50 AM EDT by telephone and verified that I am speaking with the correct person using two identifiers.   I discussed the limitations, risks, security and privacy concerns of performing an evaluation and management service by telephone and the availability of in person appointments. I also discussed with the patient that there may be a patient responsible charge related to this service. The patient expressed understanding and agreed to proceed.   History of Present Illness: HPI:                                                                Peter Mccullough is a 28 y.o. adult   Reports he had had increased panic attacks related to financial difficulties. States this was related to insurance premium error. Reports staying up until 3-4 am feeling extremely anxious and that his grounding techniques do not work for him when anxiety is that severe. Reports this disrupted his sleep schedule and he actually forgot to take his medications for several days, but has resumed taking it this week. He states he took a couple of days off to help sort things out. Surgeon moved pre-op to 10/01 and his insurance premium has been corrected. He states anxiety and panic has gotten much better since resolving his insurance/financial issues. Current medications Lexapro 15 mg nightly Alprazolam 0.25 mg tid prn panic attacks Hydroxyzine 10 mg prn (has not been taking) Zofran 4 mg Q8H prn nausea/vomiting  [facility] 01/[facility]2019 01/11/2019 12/30/2018 12/16/2018 10/14/2018  Decreased Interest 1 1 1 1 1   Down, Depressed, Hopeless 3 1 2 2 1   PHQ - 2 Score 4 2 3 3 2   Altered sleeping 3 2 2 2 2   Tired, decreased energy 2 2 2 3 2   Change in appetite 2 0 0 0 0  Feeling bad or failure about yourself  2 1 1 3 1   Trouble concentrating 2 0 1 0 1  Moving slowly or fidgety/restless 0 0 0 0 1  Suicidal thoughts 0 0 0 1 1  PHQ-9 Score 15 7 9 12  10   Difficult doing work/chores - - - Somewhat difficult Somewhat difficult    GAD 7 : Generalized Anxiety Score 01/28/2019 01/11/2019 12/30/2018 12/16/2018  Nervous, Anxious, on Edge 2 1 2 2   Control/stop worrying 2 1 2 2   Worry too much - different things 2 1 2 2   Trouble relaxing 2 2 1 1   Restless 1 0 1 1  Easily annoyed or irritable 2 0 0 1  Afraid - awful might happen 1 0 0 1  Total GAD 7 Score 12 5 8 10   Anxiety Difficulty - - - Somewhat difficult      Past Medical History:  Diagnosis Date  . Anxiety   . Gender dysphoria   . Obesity    Past Surgical History:  Procedure Laterality Date  . NO PAST SURGERIES     Social History   Tobacco Use  . Smoking status: Passive Smoke Exposure - Never Smoker  . Smokeless tobacco: Never Used  Substance Use Topics  . Alcohol use: Not Currently    Comment: 1 drink/year   family history includes COPD in his maternal uncle;  Sarcoidosis in his mother.    ROS: negative except as noted in the HPI  Medications: Current Outpatient Medications  Medication Sig Dispense Refill  . ALPRAZolam (XANAX) 0.25 MG tablet Take 1 tablet (0.25 mg total) by mouth once as needed for up to 1 dose for anxiety. 20 tablet 1  . Biotin 1 MG CAPS Take 1 capsule by mouth daily.    Marland Kitchen escitalopram (LEXAPRO) 5 MG tablet Take 3 tablets (15 mg total) by mouth every evening. 270 tablet 0  . hydrOXYzine (ATARAX/VISTARIL) 10 MG tablet Take 1-5 tablets (10-50 mg total) by mouth at bedtime as needed for anxiety (insomnia). Gradually increase by 1 tablet every 3 nights as needed for sleep. Max dose 50 mg 40 tablet 0  . Multiple Vitamin (MULTIVITAMIN) capsule Take 1 capsule by mouth daily.    Marland Kitchen NEEDLE, DISP, 25 G (BD HYPODERMIC NEEDLE) 25G X 1-1/2" MISC For testosterone Injection    . ondansetron (ZOFRAN-ODT) 4 MG disintegrating tablet Take 1 tablet (4 mg total) by mouth every 8 (eight) hours as needed for nausea or vomiting. 10 tablet 0  . SYMBICORT 80-4.5 MCG/ACT  inhaler TAKE 2 PUFFS BY MOUTH TWICE A DAY 30.6 Inhaler 1  . SYRINGE-NEEDLE, DISP, 3 ML (B-D 3CC LUER-LOK SYR 18GX1-1/2) 18G X 1-1/2" 3 ML MISC For drawing Testosterone    . Testosterone Cypionate 200 MG/ML SOLN Inject 120 mg into the muscle every 14 (fourteen) days.    . Phentermine HCl (LOMAIRA) 8 MG TABS Take 8 mg by mouth daily. Take 2 hours after breakfast/first meal of the day (Patient not taking: Reported on 01/28/2019) 30 tablet 0   No current facility-administered medications for this visit.    No Known Allergies     Objective:  There were no vitals taken for this visit. Wt Readings from Last 3 Encounters:  02/16/19 225 lb (102.1 kg)  01/11/19 218 lb (98.9 kg)  12/30/18 218 lb (98.9 kg)   Temp Readings from Last 3 Encounters:  01/11/19 (!) 97.3 F (36.3 C)  12/16/18 98.3 F (36.8 C) (Oral)  10/14/18 98.3 F (36.8 C) (Oral)   BP Readings from Last 3 Encounters:  12/14/18 119/64  10/14/18 121/66   Pulse Readings from Last 3 Encounters:  12/14/18 73  10/14/18 81    Pulm: Normal work of breathing, normal phonation Neuro: alert and oriented x 3 Psych: cooperative, anxious mood, affect mood-congruent, speech is articulate, normal rate and volume; thought processes clear and goal-directed, normal judgment, good insight   No results found for this or any previous visit (from the past 72 hour(s)). No results found.    Assessment and Plan: 28 y.o. adult with   .Kilan was seen today for follow-up.  Diagnoses and all orders for this visit:  Passive suicidal ideations -     Ambulatory referral to Psychology  Psychogenic vomiting with nausea -     ondansetron (ZOFRAN-ODT) 4 MG disintegrating tablet; Take 1 tablet (4 mg total) by mouth every 8 (eight) hours as needed for nausea or vomiting.  Generalized anxiety disorder with panic attacks -     Ambulatory referral to Psychology  Depression with anxiety -     Ambulatory referral to Psychology  Endocrine  disorder in male-to-male transgender person -     Testosterone, Total, LC/MS/MS  Encounter for monitoring testosterone replacement therapy -     Testosterone, Total, LC/MS/MS   PHQ9=15, no acute safety issues GAD7=12 Situational stressors and forgetting to take medication is contributory to worsening anxiety/depressive  symptoms Cont current medications Lexapro 15 mg nightly Alprazolam 0.25 mg tid prn panic attacks Hydroxyzine 10 mg prn (has not been taking) Zofran 4 mg Q8H prn nausea/vomiting    Follow-up in 1 month or sooner as needed if symptoms worsen or fail to improve  Follow Up Instructions:    I discussed the assessment and treatment plan with the patient. The patient was provided an opportunity to ask questions and all were answered. The patient agreed with the plan and demonstrated an understanding of the instructions.   The patient was advised to call back or seek an in-person evaluation if the symptoms worsen or if the condition fails to improve as anticipated.  I provided 10 minutes of non-face-to-face time during this encounter.   Trixie Dredge, [place]

## 2019-02-16 ENCOUNTER — Other Ambulatory Visit: Payer: Self-pay

## 2019-02-16 ENCOUNTER — Ambulatory Visit (HOSPITAL_BASED_OUTPATIENT_CLINIC_OR_DEPARTMENT_OTHER): Payer: BLUE CROSS/BLUE SHIELD | Attending: Physician Assistant | Admitting: Internal Medicine

## 2019-02-16 DIAGNOSIS — Z9189 Other specified personal risk factors, not elsewhere classified: Secondary | ICD-10-CM | POA: Diagnosis not present

## 2019-02-19 DIAGNOSIS — Z01812 Encounter for preprocedural laboratory examination: Secondary | ICD-10-CM | POA: Diagnosis not present

## 2019-02-19 DIAGNOSIS — Z1159 Encounter for screening for other viral diseases: Secondary | ICD-10-CM | POA: Diagnosis not present

## 2019-02-19 DIAGNOSIS — F641 Gender identity disorder in adolescence and adulthood: Secondary | ICD-10-CM | POA: Diagnosis not present

## 2019-02-19 DIAGNOSIS — Z20828 Contact with and (suspected) exposure to other viral communicable diseases: Secondary | ICD-10-CM | POA: Diagnosis not present

## 2019-02-22 DIAGNOSIS — F418 Other specified anxiety disorders: Secondary | ICD-10-CM | POA: Diagnosis not present

## 2019-02-22 DIAGNOSIS — N6489 Other specified disorders of breast: Secondary | ICD-10-CM | POA: Diagnosis not present

## 2019-02-22 DIAGNOSIS — Z79899 Other long term (current) drug therapy: Secondary | ICD-10-CM | POA: Diagnosis not present

## 2019-02-22 DIAGNOSIS — Z7951 Long term (current) use of inhaled steroids: Secondary | ICD-10-CM | POA: Diagnosis not present

## 2019-02-22 DIAGNOSIS — Z79890 Hormone replacement therapy (postmenopausal): Secondary | ICD-10-CM | POA: Diagnosis not present

## 2019-02-22 DIAGNOSIS — F641 Gender identity disorder in adolescence and adulthood: Secondary | ICD-10-CM | POA: Diagnosis not present

## 2019-02-22 DIAGNOSIS — J45909 Unspecified asthma, uncomplicated: Secondary | ICD-10-CM | POA: Diagnosis not present

## 2019-02-22 DIAGNOSIS — F649 Gender identity disorder, unspecified: Secondary | ICD-10-CM | POA: Diagnosis not present

## 2019-02-22 DIAGNOSIS — F640 Transsexualism: Secondary | ICD-10-CM | POA: Diagnosis not present

## 2019-02-26 DIAGNOSIS — Z9189 Other specified personal risk factors, not elsewhere classified: Secondary | ICD-10-CM | POA: Diagnosis not present

## 2019-02-26 NOTE — Procedures (Signed)
    Patient Name: Peter Mccullough, Peter Mccullough Date: 02/17/2019 Gender: Male D.O.B: 02-07-1991 Age (years): 28 Referring Provider: Trixie Dredge PA-C Height (inches): 60 Interpreting Physician: Baird Lyons MD, ABSM Weight (lbs): 225 RPSGT: Jacolyn Reedy BMI: 44 MRN: [id number] Neck Size: 17.00  CLINICAL INFORMATION Sleep Study Type: HST Indication for sleep study: Snoring (786.09) Epworth Sleepiness Score: 5  SLEEP STUDY TECHNIQUE A multi-channel overnight portable sleep study was performed. The channels recorded were: nasal airflow, thoracic respiratory movement, and oxygen saturation with a pulse oximetry. Snoring was also monitored.  MEDICATIONS Patient self administered medications include: none reported.  SLEEP ARCHITECTURE Patient was studied for 467 minutes. The sleep efficiency was 100.0 % and the patient was supine for 32.6%. The arousal index was 0.0 per hour.  RESPIRATORY PARAMETERS The overall AHI was 4.9 per hour, with a central apnea index of 0.0 per hour. The oxygen nadir was 90% during sleep.  CARDIAC DATA Mean heart rate during sleep was 65.6 bpm.  IMPRESSIONS - Occasional obstructive apneas, within normal limits (AHI = 4.9/h). - No significant central sleep apnea occurred during this study (CAI = 0.0/h). - The patient had minimal or no oxygen desaturation during the study (Min O2 = 90%) - Patient snored.  DIAGNOSIS - Normal study  RECOMMENDATIONS - Suggest sleep position off back, normal body weight, manage for nasal congestion if appropriate. - Be careful with alcohol, sedatives and other CNS depressants that may worsen sleep apnea and disrupt normal sleep architecture. - Sleep hygiene should be reviewed to assess factors that may improve sleep quality. - Weight management and regular exercise should be initiated or continued.  [Electronically signed] 02/26/2019 10:32 AM  Baird Lyons MD, ABSM Diplomate, American Board of Sleep  Medicine   NPI: [id number]                         [place]  ELECTRONICALLY SIGNED ON:  02/26/2019, 10:19 AM [place] PH: [phone]   FX: [phone] [place]

## 2019-03-10 ENCOUNTER — Other Ambulatory Visit: Payer: Self-pay | Admitting: Physician Assistant

## 2019-03-10 DIAGNOSIS — F410 Panic disorder [episodic paroxysmal anxiety] without agoraphobia: Secondary | ICD-10-CM

## 2019-03-10 DIAGNOSIS — F411 Generalized anxiety disorder: Secondary | ICD-10-CM

## 2019-03-10 DIAGNOSIS — F331 Major depressive disorder, recurrent, moderate: Secondary | ICD-10-CM

## 2019-03-14 ENCOUNTER — Ambulatory Visit: Payer: BLUE CROSS/BLUE SHIELD | Admitting: Pulmonary Disease

## 2019-03-14 ENCOUNTER — Encounter: Payer: Self-pay | Admitting: Pulmonary Disease

## 2019-03-14 ENCOUNTER — Other Ambulatory Visit: Payer: Self-pay

## 2019-03-14 VITALS — BP 106/72 | HR 92 | Temp 97.3°F | Ht 61.0 in | Wt 242.6 lb

## 2019-03-14 DIAGNOSIS — J42 Unspecified chronic bronchitis: Secondary | ICD-10-CM | POA: Diagnosis not present

## 2019-03-14 LAB — CBC WITH DIFFERENTIAL/PLATELET
Basophils Absolute: 0.1 10*3/uL (ref 0.0–0.1)
Basophils Relative: 0.7 % (ref 0.0–3.0)
Eosinophils Absolute: 0.2 10*3/uL (ref 0.0–0.7)
Eosinophils Relative: 2.7 % (ref 0.0–5.0)
HCT: 45.3 % (ref 39.0–52.0)
Hemoglobin: 15.2 g/dL (ref 13.0–17.0)
Lymphocytes Relative: 26.4 % (ref 12.0–46.0)
Lymphs Abs: 2.1 10*3/uL (ref 0.7–4.0)
MCHC: 33.7 g/dL (ref 30.0–36.0)
MCV: 89.6 fl (ref 78.0–100.0)
Monocytes Absolute: 1.1 10*3/uL — ABNORMAL HIGH (ref 0.1–1.0)
Monocytes Relative: 13.4 % — ABNORMAL HIGH (ref 3.0–12.0)
Neutro Abs: 4.5 10*3/uL (ref 1.4–7.7)
Neutrophils Relative %: 56.8 % (ref 43.0–77.0)
Platelets: 311 10*3/uL (ref 150.0–400.0)
RBC: 5.05 Mil/uL (ref 4.22–5.81)
RDW: 13 % (ref 11.5–15.5)
WBC: 8 10*3/uL (ref 4.0–10.5)

## 2019-03-14 MED ORDER — BUDESONIDE-FORMOTEROL FUMARATE 160-4.5 MCG/ACT IN AERO: 2.0000 | INHALATION_SPRAY | Freq: Two times a day (BID) | RESPIRATORY_TRACT | 6 refills | Status: AC

## 2019-03-14 MED ORDER — ALBUTEROL SULFATE HFA 108 (90 BASE) MCG/ACT IN AERS: 2.0000 | INHALATION_SPRAY | Freq: Four times a day (QID) | RESPIRATORY_TRACT | 6 refills | Status: AC | PRN

## 2019-03-14 NOTE — Patient Instructions (Signed)
Suspected Asthma  Tests ordered: --We will arrange for Pulmonary function tests to rule out asthma. Prior to this to test, you will be COVID testing. Please HOLD your inhalers for two days prior to testing.  --Lab work before you leave: CBC with diff, IgE  Medications ordered:  START Symbicort 160-4.5 two puffs twice a day. THIS IS EVERYDAY INHALER  START Albuterol as needed for shortness of breath or wheezing. THIS IS YOUR RESCUE INHALER  Asthma Action Plan Increase Symbicort to 2 puffs  THREE times a day for worsening shortness of breath, wheezing and cough. If you symptoms do not improve in 24-48 hours, please our office for evaluation and/or prednisone taper.  Follow-up in 3 months or after PFTs

## 2019-03-14 NOTE — Progress Notes (Signed)
Subjective:   PATIENT ID: Peter Mccullough GENDER: transgender male / male-to-male DOB: 01/20/1991, MRN: [id number]   HPI  Chief Complaint  Patient presents with  . Consult    dyspnea-really bad since beginning of summer 2020 with activity and hot climates, constant huff cough productive thick white sputum. started symbicort 80 inhaler may 2020    Reason for Visit: New consult for shortness of breath  Mr. Peter Mccullough (Nag-gy) is a 28 year old F-M never smoker with hx childhood asthma and anxiety who presents for evaluation of shortness of breath.  For the last 5 months, he has had dyspnea exacerbated by exertion and humidity changes. Associated with wheezing and non-productive cough with occasional white sputum. Rest and cooler rooms will help with his breathing. Strong odors/perfumes, small dusts/particles and heat will trigger his symptoms. His PCP prescribed on Symbicort 80-4.5 2 puffs twice a day and felt this helped with 50-75% better. Since his mastectomy 3 weeks ago, he has not taken his inhalers and has limited his activity due to lifting restraints up to 10-20 lb. He also feels that he has been less active since the surgery and believes this is contributing to shortness of breath.  ACT 15  Social History: Significant second-hand smoke exposure  I have personally reviewed patient's past medical/family/social history, allergies, current medications.  Past Medical History:  Diagnosis Date  . Anxiety   . Gender dysphoria   . Obesity   . Pneumonia      Family History  Problem Relation Age of Onset  . Sarcoidosis Mother   . COPD Maternal Uncle   . Emphysema Maternal Uncle      Social History   Occupational History  . Not on file  Tobacco Use  . Smoking status: Passive Smoke Exposure - Never Smoker  . Smokeless tobacco: Never Used  Substance and Sexual Activity  . Alcohol use: Not Currently    Comment: 1 drink/year  . Drug use: Never  . Sexual activity: Yes   Birth control/protection: None    No Known Allergies   Outpatient Medications Prior to Visit  Medication Sig Dispense Refill  . ALPRAZolam (XANAX) 0.25 MG tablet Take 1 tablet (0.25 mg total) by mouth once as needed for up to 1 dose for anxiety. 20 tablet 1  . Biotin 1 MG CAPS Take 1 capsule by mouth daily.    Marland Kitchen escitalopram (LEXAPRO) 5 MG tablet Take 3 tablets (15 mg total) by mouth every evening. NEEDS APPT 270 tablet 0  . hydrOXYzine (ATARAX/VISTARIL) 10 MG tablet Take 1-5 tablets (10-50 mg total) by mouth at bedtime as needed for anxiety (insomnia). Gradually increase by 1 tablet every 3 nights as needed for sleep. Max dose 50 mg 40 tablet 0  . Multiple Vitamin (MULTIVITAMIN) capsule Take 1 capsule by mouth daily.    Marland Kitchen NEEDLE, DISP, 25 G (BD HYPODERMIC NEEDLE) 25G X 1-1/2" MISC For testosterone Injection    . ondansetron (ZOFRAN-ODT) 4 MG disintegrating tablet Take 1 tablet (4 mg total) by mouth every 8 (eight) hours as needed for nausea or vomiting. 20 tablet 0  . SYMBICORT 80-4.5 MCG/ACT inhaler TAKE 2 PUFFS BY MOUTH TWICE A DAY 30.6 Inhaler 1  . SYRINGE-NEEDLE, DISP, 3 ML (B-D 3CC LUER-LOK SYR 18GX1-1/2) 18G X 1-1/2" 3 ML MISC For drawing Testosterone    . Testosterone Cypionate 200 MG/ML SOLN Inject 120 mg into the muscle every 14 (fourteen) days.     No facility-administered medications prior to visit.  Review of Systems  Constitutional: Negative for chills, diaphoresis, fever, malaise/fatigue and weight loss.  HENT: Positive for congestion. Negative for ear pain and sore throat.   Respiratory: Positive for cough, sputum production, shortness of breath and wheezing. Negative for hemoptysis.   Cardiovascular: Positive for chest pain. Negative for palpitations and leg swelling.  Gastrointestinal: Negative for abdominal pain, heartburn and nausea.  Genitourinary: Negative for frequency.  Musculoskeletal: Negative for joint pain and myalgias.  Skin: Negative for itching and rash.   Neurological: Negative for dizziness, weakness and headaches.  Endo/Heme/Allergies: Does not bruise/bleed easily.  Psychiatric/Behavioral: Positive for depression. The patient is nervous/anxious.      Objective:   Vitals:   03/14/19 1421 03/14/19 1422  BP:  106/72  Pulse:  92  Temp: (!) 97.3 F (36.3 C)   TempSrc: Temporal   SpO2:  98%  Weight: 242 lb 9.6 oz (110 kg)   Height: 5\' 1"  (1.549 m)    SpO2: 98 % O2 Device: None (Room air)  Physical Exam: General: Well-appearing, no acute distress, chest binder in place HENT: [place], AT Eyes: EOMI, no scleral icterus Respiratory: Clear to auscultation bilaterally.  No crackles, wheezing or rales Cardiovascular: RRR, -M/R/G, no JVD GI: BS+, soft, nontender Extremities:-Edema,-tenderness Neuro: AAO x4, CNII-XII grossly intact Skin: Intact, no rashes or bruising Psych: Normal mood, normal affect  Data Reviewed:  Imaging: CXR 10/14/18 - Normal CXR. No infiltrates, edema or effusion  PFT: None on file  Labs: CBC    Component Value Date/Time   WBC 8.7 10/14/2018 1608   RBC 5.70 10/14/2018 1608   HGB 16.8 10/14/2018 1608   HCT 50.8 (H) 10/14/2018 1608   PLT 302 10/14/2018 1608   MCV 89.1 10/14/2018 1608   MCH 29.5 10/14/2018 1608   MCHC 33.1 10/14/2018 1608   RDW 12.4 10/14/2018 1608   LYMPHSABS 2,105 10/14/2018 1608   EOSABS 122 10/14/2018 1608   BASOSABS 44 10/14/2018 1608   BMET    Component Value Date/Time   NA 140 10/14/2018 1608   K 4.3 10/14/2018 1608   CL 104 10/14/2018 1608   CO2 25 10/14/2018 1608   GLUCOSE 85 10/14/2018 1608   BUN 13 10/14/2018 1608   CREATININE 0.97 10/14/2018 1608   CALCIUM 9.8 10/14/2018 1608   GFRNONAA 107 10/14/2018 1608   GFRAA 123 10/14/2018 1608    Imaging, labs and tests noted above have been reviewed independently by me.    Assessment & Plan:   Discussion: 28 year old F->M with childhood asthma who presents with new symptoms of shortness of breath, wheezing and  non-productive cough concerning for asthma vs chronic bronchitis. Partially responsive to ICS/LABA. Will obtain PFTs to rule out underlying obstructive lung disease.  Suspected Asthma  Tests ordered: --We will arrange for Pulmonary function tests to rule out asthma. Prior to this to test, you will be COVID testing. Please HOLD your inhalers for two days prior to testing.  --Lab work before you leave: CBC with diff, IgE  Medications ordered:  START Symbicort 160-4.5 two puffs twice a day. THIS IS EVERYDAY INHALER  START Albuterol as needed for shortness of breath or wheezing. THIS IS YOUR RESCUE INHALER  Asthma Action Plan Increase Symbicort to 2 puffs  THREE times a day for worsening shortness of breath, wheezing and cough. If you symptoms do not improve in 24-48 hours, please our office for evaluation and/or prednisone taper.   Health Maintenance Immunization History  Administered Date(s) Administered  . Influenza,inj,Quad PF,6+ Mos  02/24/2018, 02/16/2019   Orders Placed This Encounter  Procedures  . IgE    Standing Status:   Future    Number of Occurrences:   1    Standing Expiration Date:   03/13/2020  . CBC with Differential/Platelet    Standing Status:   Future    Number of Occurrences:   1    Standing Expiration Date:   03/13/2020  . Pulmonary function test    Standing Status:   Future    Standing Expiration Date:   03/13/2020    Order Specific Question:   Where should this test be performed?    Answer:   [facility]    Order Specific Question:   Full PFT: includes the following: basic spirometry, spirometry pre & post bronchodilator, diffusion capacity (DLCO), lung volumes    Answer:   Full PFT   Meds ordered this encounter  Medications  . budesonide-formoterol (SYMBICORT) 160-4.5 MCG/ACT inhaler    Sig: Inhale 2 puffs into the lungs 2 (two) times daily.    Dispense:  1 Inhaler    Refill:  6  . albuterol (VENTOLIN HFA) 108 (90 Base) MCG/ACT inhaler    Sig:  Inhale 2 puffs into the lungs every 6 (six) hours as needed for wheezing or shortness of breath.    Dispense:  6.7 g    Refill:  6    Return in about 3 months (around 06/14/2019), or with me.  Jessilyn Catino Mechele Collin, MD [facility] 03/14/2019 2:29 PM  Office Number [phone]

## 2019-03-16 LAB — IGE: IgE (Immunoglobulin E), Serum: 7 kU/L (ref <=114)

## 2019-04-12 ENCOUNTER — Encounter: Payer: Self-pay | Admitting: *Deleted

## 2019-04-12 NOTE — Progress Notes (Signed)
LMTCB x 3 and letter mailed

## 2019-05-23 DIAGNOSIS — Z20822 Contact with and (suspected) exposure to covid-19: Secondary | ICD-10-CM | POA: Diagnosis not present

## 2019-05-23 DIAGNOSIS — Z79899 Other long term (current) drug therapy: Secondary | ICD-10-CM | POA: Diagnosis not present

## 2019-05-23 DIAGNOSIS — R05 Cough: Secondary | ICD-10-CM | POA: Diagnosis not present

## 2019-05-23 DIAGNOSIS — R079 Chest pain, unspecified: Secondary | ICD-10-CM | POA: Diagnosis not present

## 2019-05-23 DIAGNOSIS — R112 Nausea with vomiting, unspecified: Secondary | ICD-10-CM | POA: Diagnosis not present

## 2019-05-23 DIAGNOSIS — J45909 Unspecified asthma, uncomplicated: Secondary | ICD-10-CM | POA: Diagnosis not present

## 2019-06-08 ENCOUNTER — Other Ambulatory Visit: Payer: Self-pay | Admitting: Osteopathic Medicine

## 2019-06-08 DIAGNOSIS — F410 Panic disorder [episodic paroxysmal anxiety] without agoraphobia: Secondary | ICD-10-CM

## 2019-06-08 DIAGNOSIS — F331 Major depressive disorder, recurrent, moderate: Secondary | ICD-10-CM

## 2019-06-08 DIAGNOSIS — F411 Generalized anxiety disorder: Secondary | ICD-10-CM

## 2019-06-08 NOTE — Telephone Encounter (Signed)
Please review for refill- patient at [facility] 

## 2019-06-22 ENCOUNTER — Other Ambulatory Visit: Payer: Self-pay | Admitting: Osteopathic Medicine

## 2019-06-22 DIAGNOSIS — F410 Panic disorder [episodic paroxysmal anxiety] without agoraphobia: Secondary | ICD-10-CM

## 2019-06-22 DIAGNOSIS — F331 Major depressive disorder, recurrent, moderate: Secondary | ICD-10-CM

## 2019-06-22 NOTE — Telephone Encounter (Signed)
Must make appointment for future refills 

## 2019-06-22 NOTE — Telephone Encounter (Signed)
Please review for refill- patient at [facility]. Pharmacy is requesting changes in Rx.

## 2019-11-01 DIAGNOSIS — F649 Gender identity disorder, unspecified: Secondary | ICD-10-CM | POA: Diagnosis not present

## 2019-11-01 DIAGNOSIS — F648 Other gender identity disorders: Secondary | ICD-10-CM | POA: Diagnosis not present

## 2019-11-15 DIAGNOSIS — J029 Acute pharyngitis, unspecified: Secondary | ICD-10-CM | POA: Diagnosis not present

## 2019-11-15 DIAGNOSIS — J069 Acute upper respiratory infection, unspecified: Secondary | ICD-10-CM | POA: Diagnosis not present

## 2019-11-23 DIAGNOSIS — F330 Major depressive disorder, recurrent, mild: Secondary | ICD-10-CM | POA: Diagnosis not present

## 2019-11-23 DIAGNOSIS — G5600 Carpal tunnel syndrome, unspecified upper limb: Secondary | ICD-10-CM | POA: Diagnosis not present

## 2019-11-23 DIAGNOSIS — F410 Panic disorder [episodic paroxysmal anxiety] without agoraphobia: Secondary | ICD-10-CM | POA: Diagnosis not present

## 2019-11-23 DIAGNOSIS — F419 Anxiety disorder, unspecified: Secondary | ICD-10-CM | POA: Diagnosis not present

## 2019-12-26 DIAGNOSIS — F5105 Insomnia due to other mental disorder: Secondary | ICD-10-CM | POA: Diagnosis not present

## 2019-12-26 DIAGNOSIS — F410 Panic disorder [episodic paroxysmal anxiety] without agoraphobia: Secondary | ICD-10-CM | POA: Diagnosis not present

## 2019-12-26 DIAGNOSIS — F330 Major depressive disorder, recurrent, mild: Secondary | ICD-10-CM | POA: Diagnosis not present

## 2020-01-06 DIAGNOSIS — Z0271 Encounter for disability determination: Secondary | ICD-10-CM | POA: Diagnosis not present

## 2020-01-06 DIAGNOSIS — F330 Major depressive disorder, recurrent, mild: Secondary | ICD-10-CM | POA: Diagnosis not present

## 2020-01-06 DIAGNOSIS — F419 Anxiety disorder, unspecified: Secondary | ICD-10-CM | POA: Diagnosis not present

## 2020-01-06 DIAGNOSIS — F410 Panic disorder [episodic paroxysmal anxiety] without agoraphobia: Secondary | ICD-10-CM | POA: Diagnosis not present

## 2020-01-10 DIAGNOSIS — F419 Anxiety disorder, unspecified: Secondary | ICD-10-CM | POA: Diagnosis not present

## 2020-01-10 DIAGNOSIS — F4323 Adjustment disorder with mixed anxiety and depressed mood: Secondary | ICD-10-CM | POA: Diagnosis not present

## 2020-01-10 DIAGNOSIS — R42 Dizziness and giddiness: Secondary | ICD-10-CM | POA: Diagnosis not present

## 2020-01-10 DIAGNOSIS — R0789 Other chest pain: Secondary | ICD-10-CM | POA: Diagnosis not present

## 2020-01-10 DIAGNOSIS — F330 Major depressive disorder, recurrent, mild: Secondary | ICD-10-CM | POA: Diagnosis not present

## 2020-01-19 DIAGNOSIS — F4323 Adjustment disorder with mixed anxiety and depressed mood: Secondary | ICD-10-CM | POA: Diagnosis not present

## 2020-01-30 DIAGNOSIS — F419 Anxiety disorder, unspecified: Secondary | ICD-10-CM | POA: Diagnosis not present

## 2020-01-30 DIAGNOSIS — Z0000 Encounter for general adult medical examination without abnormal findings: Secondary | ICD-10-CM | POA: Diagnosis not present

## 2020-02-02 DIAGNOSIS — F4323 Adjustment disorder with mixed anxiety and depressed mood: Secondary | ICD-10-CM | POA: Diagnosis not present

## 2020-02-23 DIAGNOSIS — F4323 Adjustment disorder with mixed anxiety and depressed mood: Secondary | ICD-10-CM | POA: Diagnosis not present

## 2020-03-06 DIAGNOSIS — F4323 Adjustment disorder with mixed anxiety and depressed mood: Secondary | ICD-10-CM | POA: Diagnosis not present

## 2020-04-05 DIAGNOSIS — F4323 Adjustment disorder with mixed anxiety and depressed mood: Secondary | ICD-10-CM | POA: Diagnosis not present

## 2020-04-19 DIAGNOSIS — F4323 Adjustment disorder with mixed anxiety and depressed mood: Secondary | ICD-10-CM | POA: Diagnosis not present

## 2020-05-17 ENCOUNTER — Other Ambulatory Visit: Payer: Self-pay

## 2020-05-17 ENCOUNTER — Emergency Department (INDEPENDENT_AMBULATORY_CARE_PROVIDER_SITE_OTHER)
Admission: EM | Admit: 2020-05-17 | Discharge: 2020-05-17 | Disposition: A | Discharge status: Home / Self Care | Payer: BLUE CROSS/BLUE SHIELD | Source: Home / Self Care

## 2020-05-17 ENCOUNTER — Emergency Department: Admit: 2020-05-17 | Discharge status: Absent without leave | Payer: Self-pay

## 2020-05-17 DIAGNOSIS — J069 Acute upper respiratory infection, unspecified: Secondary | ICD-10-CM

## 2020-05-17 HISTORY — DX: Unspecified asthma, uncomplicated: J45.909

## 2020-05-17 NOTE — Discharge Instructions (Signed)
  You may take 500mg acetaminophen every 4-6 hours or in combination with ibuprofen 400-600mg every 6-8 hours as needed for pain, inflammation, and fever.  Be sure to well hydrated with clear liquids and get at least 8 hours of sleep at night, preferably more while sick.   Please follow up with family medicine in 1 week if needed.   

## 2020-05-17 NOTE — ED Triage Notes (Signed)
Pt presents to Urgent Care with c/o fatigue, dizziness, shortness of breath, cough, and "disorientation" x 3 days. Pt suspects being exposed to COVID by coworker; had one dose of vaccine. Pt is a/o x 4, no neurological deficits noted.

## 2020-05-17 NOTE — ED Provider Notes (Addendum)
[facility]    CSN: [id number] Arrival date & time: 05/17/20  1813      History   Chief Complaint Chief Complaint  Patient presents with  . Fatigue  . Dizziness  . Fever    HPI Peter Mccullough is a 29 y.o. adult.   HPI Peter Mccullough is a 29 y.o. adult presenting to UC with c/o fatigue, body aches, mild intermittent dizziness, mild SOB and cough for 3 days.  Pt was exposed to COVID at work last week, pt concerned he has COVID.  Pt had 1 dose of the vaccine 2-3 months ago but due to work scheduling he never got around to getting the second dose.  Denies fever, chills, n/v/d. No chest pain or SOB at this time.  Pt notes he also recently changed work schedule and thought the body aches, fatigue and sensation of feeling "disoriented" was due to shift change but after developing congestion, pt is concerned for COVID.  Past Medical History:  Diagnosis Date  . Anxiety   . Asthma   . Gender dysphoria   . Obesity   . Pneumonia     Patient Active Problem List   Diagnosis Date Noted  . Passive suicidal ideations 01/11/2019  . Major depression in partial remission (HCC) 01/11/2019  . At risk for obstructive sleep apnea 10/25/2018  . Snoring 10/25/2018  . Generalized anxiety disorder with panic attacks 09/20/2018  . Chronic dyspnea 09/20/2018  . Family history of sarcoidosis 09/20/2018  . Class 2 obesity due to excess calories in adult 09/20/2018  . Passive smoke exposure 09/20/2018  . Deliberate self-cutting 09/20/2018  . Male-to-male transgender person 05/02/2018    Past Surgical History:  Procedure Laterality Date  . MASTECTOMY    . NO PAST SURGERIES      OB History   No obstetric history on file.      Home Medications    Prior to Admission medications   Medication Sig Start Date End Date Taking? Authorizing Provider  albuterol (VENTOLIN HFA) 108 (90 Base) MCG/ACT inhaler Inhale 2 puffs into the lungs every 6 (six) hours as needed for wheezing or shortness  of breath. 03/14/19   Luciano Cutter, MD  ALPRAZolam Prudy Feeler) 0.25 MG tablet Take 1 tablet (0.25 mg total) by mouth once as needed for up to 1 dose for anxiety. 12/16/18   Carlis Stable, PA-C  Biotin 1 MG CAPS Take 1 capsule by mouth daily.    [provider]  budesonide-formoterol (SYMBICORT) 160-4.5 MCG/ACT inhaler Inhale 2 puffs into the lungs 2 (two) times daily. 03/14/19   Luciano Cutter, MD  escitalopram (LEXAPRO) 5 MG tablet Take 3 tablets (15 mg total) by mouth daily. MUST MAKE APPOINTMENT FOR FUTURE REFILLS 06/22/19   Tandy Gaw L, PA-C  hydrOXYzine (ATARAX/VISTARIL) 10 MG tablet Take 1-5 tablets (10-50 mg total) by mouth at bedtime as needed for anxiety (insomnia). Gradually increase by 1 tablet every 3 nights as needed for sleep. Max dose 50 mg 12/30/18   Carlis Stable, PA-C  Multiple Vitamin (MULTIVITAMIN) capsule Take 1 capsule by mouth daily.    [provider]  NEEDLE, DISP, 25 G (BD HYPODERMIC NEEDLE) 25G X 1-1/2" MISC For testosterone Injection 04/28/18   [provider]  ondansetron (ZOFRAN-ODT) 4 MG disintegrating tablet Take 1 tablet (4 mg total) by mouth every 8 (eight) hours as needed for nausea or vomiting. 01/28/19   Carlis Stable, PA-C  SYMBICORT 80-4.5 MCG/ACT inhaler TAKE 2 PUFFS BY  MOUTH TWICE A DAY Patient not taking: Reported on 05/17/2020 01/10/19   Carlis Stable, PA-C  SYRINGE-NEEDLE, DISP, 3 ML (B-D 3CC LUER-LOK SYR 18GX1-1/2) 18G X 1-1/2" 3 ML MISC For drawing Testosterone 04/28/18   [provider]  Testosterone Cypionate 200 MG/ML SOLN Inject 120 mg into the muscle every 14 (fourteen) days. 04/28/18   [provider]    Family History Family History  Problem Relation Age of Onset  . Sarcoidosis Mother   . COPD Maternal Uncle   . Emphysema Maternal Uncle     Social History Social History   Tobacco Use  . Smoking status: Passive Smoke Exposure - Never  Smoker  . Smokeless tobacco: Never Used  Vaping Use  . Vaping Use: Never used  Substance Use Topics  . Alcohol use: Not Currently    Comment: 1 drink/year  . Drug use: Never     Allergies   Patient has no known allergies.   Review of Systems Review of Systems  Constitutional: Positive for fatigue. Negative for chills and fever.  HENT: Positive for congestion. Negative for ear pain, sore throat, trouble swallowing and voice change.   Respiratory: Positive for cough and shortness of breath (mild chest tightness).   Cardiovascular: Negative for chest pain and palpitations.  Gastrointestinal: Negative for abdominal pain, diarrhea, nausea and vomiting.  Musculoskeletal: Positive for arthralgias and myalgias. Negative for back pain.  Skin: Negative for rash.  Neurological: Positive for dizziness and headaches. Negative for light-headedness.  All other systems reviewed and are negative.    Physical Exam Triage Vital Signs ED Triage Vitals  Enc Vitals Group     BP 05/17/20 1959 125/86     Pulse Rate 05/17/20 1959 91     Resp 05/17/20 1959 20     Temp 05/17/20 1959 98.8 F (37.1 C)     Temp Source 05/17/20 1959 Oral     SpO2 05/17/20 1959 97 %     Weight 05/17/20 1949 230 lb (104.3 kg)     Height 05/17/20 1949 5\' 1"  (1.549 m)     Head Circumference --      Peak Flow --      Pain Score --      Pain Loc --      Pain Edu? --      Excl. in GC? --    No data found.  Updated Vital Signs BP 125/86 (BP Location: Right Arm)   Pulse 91   Temp 98.8 F (37.1 C) (Oral)   Resp 20   Ht 5\' 1"  (1.549 m)   Wt 230 lb (104.3 kg)   SpO2 97%   BMI 43.46 kg/m   Visual Acuity Right Eye Distance:   Left Eye Distance:   Bilateral Distance:    Right Eye Near:   Left Eye Near:    Bilateral Near:     Physical Exam Vitals and nursing note reviewed.  Constitutional:      General: He is not in acute distress.    Appearance: Normal appearance. He is well-developed and  well-nourished. He is not ill-appearing, toxic-appearing or diaphoretic.  HENT:     Head: Normocephalic and atraumatic.     Right Ear: Tympanic membrane and ear canal normal.     Left Ear: Tympanic membrane and ear canal normal.     Nose: Congestion present.     Right Sinus: No maxillary sinus tenderness or frontal sinus tenderness.     Left Sinus: No maxillary sinus tenderness or  frontal sinus tenderness.     Mouth/Throat:     Lips: Pink.     Mouth: Mucous membranes are moist.     Pharynx: Oropharynx is clear. Uvula midline.  Eyes:     General: No scleral icterus.    Conjunctiva/sclera: Conjunctivae normal.  Cardiovascular:     Rate and Rhythm: Normal rate and regular rhythm.     Heart sounds: Normal heart sounds.  Pulmonary:     Effort: Pulmonary effort is normal. No respiratory distress.     Breath sounds: Normal breath sounds. No wheezing or rales.  Chest:     Chest wall: No tenderness.  Abdominal:     General: Bowel sounds are normal. There is no distension.     Palpations: Abdomen is soft. There is no mass.     Tenderness: There is no abdominal tenderness. There is no guarding or rebound.  Musculoskeletal:        General: Normal range of motion.     Cervical back: Normal range of motion and neck supple.  Skin:    General: Skin is warm and dry.  Neurological:     General: No focal deficit present.     Mental Status: He is alert and oriented to person, place, and time.     Cranial Nerves: No cranial nerve deficit.     Sensory: No sensory deficit.     Motor: No weakness.     Coordination: Coordination normal.     Gait: Gait normal.  Psychiatric:        Mood and Affect: Mood normal.      UC Treatments / Results  Labs (all labs ordered are listed, but only abnormal results are displayed) Labs Reviewed  COVID-19, FLU A+B AND RSV    EKG   Radiology No results found.  Procedures Procedures (including critical care time)  Medications Ordered in  UC Medications - No data to display  Initial Impression / Assessment and Plan / UC Course  I have reviewed the triage vital signs and the nursing notes.  Pertinent labs & imaging results that were available during my care of the patient were reviewed by me and considered in my medical decision making (see chart for details).     No evidence of bacterial infection at this time. COVID/Flu/RSV pending Encouraged symptomatic tx F/u with PCP as needed AVS given  Final Clinical Impressions(s) / UC Diagnoses   Final diagnoses:  Upper respiratory tract infection, unspecified type     Discharge Instructions      You may take 500mg  acetaminophen every 4-6 hours or in combination with ibuprofen 400-600mg  every 6-8 hours as needed for pain, inflammation, and fever.  Be sure to well hydrated with clear liquids and get at least 8 hours of sleep at night, preferably more while sick.   Please follow up with family medicine in 1 week if needed.     ED Prescriptions    None     PDMP not reviewed this encounter.   , PA-C 05/18/20 0811    05/20/20, PA-C 05/18/20 [phone]

## 2020-05-21 LAB — COVID-19, FLU A+B AND RSV
Influenza A, NAA: NOT DETECTED
Influenza B, NAA: NOT DETECTED
RSV, NAA: NOT DETECTED
SARS-CoV-2, NAA: NOT DETECTED

## 2020-06-13 ENCOUNTER — Emergency Department: Admit: 2020-06-13 | Discharge status: Absent without leave | Payer: Self-pay

## 2020-06-13 ENCOUNTER — Other Ambulatory Visit: Payer: Self-pay

## 2020-06-13 ENCOUNTER — Emergency Department (INDEPENDENT_AMBULATORY_CARE_PROVIDER_SITE_OTHER)
Admission: EM | Admit: 2020-06-13 | Discharge: 2020-06-13 | Disposition: A | Discharge status: Home / Self Care | Payer: 59 | Source: Home / Self Care

## 2020-06-13 DIAGNOSIS — N3001 Acute cystitis with hematuria: Secondary | ICD-10-CM

## 2020-06-13 LAB — POCT URINALYSIS DIP (MANUAL ENTRY)
Bilirubin, UA: NEGATIVE
Glucose, UA: NEGATIVE mg/dL
Ketones, POC UA: NEGATIVE mg/dL
Nitrite, UA: NEGATIVE
Protein Ur, POC: 100 mg/dL — AB
Spec Grav, UA: 1.025 (ref 1.010–1.025)
Urobilinogen, UA: 0.2 E.U./dL
pH, UA: 5.5 (ref 5.0–8.0)

## 2020-06-13 MED ORDER — CEPHALEXIN 500 MG PO CAPS: 500.0000 mg | ORAL_CAPSULE | Freq: Two times a day (BID) | ORAL | 0 refills | Status: AC

## 2020-06-13 MED ORDER — FLUCONAZOLE 200 MG PO TABS: 200.0000 mg | ORAL_TABLET | Freq: Once | ORAL | 0 refills | Status: AC

## 2020-06-13 NOTE — ED Provider Notes (Signed)
[facility]    CSN: [id number] Arrival date & time: 06/13/20  1415      History   Chief Complaint Chief Complaint  Patient presents with  . Urinary Frequency    HPI Peter Mccullough is a 30 y.o. adult.   HPI  Peter Mccullough is a 30 y.o. adult transitioning from male to male, presenting to UC with c/o urinary frequency that started yesterday.  Pt noted blood in urine today along with bladder pressure and lower abdominal pain.  Hx of UTI in the past after getting "an injection." denies fever, chills, n/v/d.    Past Medical History:  Diagnosis Date  . Anxiety   . Asthma   . Gender dysphoria   . Obesity   . Pneumonia     Patient Active Problem List   Diagnosis Date Noted  . Passive suicidal ideations 01/11/2019  . Major depression in partial remission (HCC) 01/11/2019  . At risk for obstructive sleep apnea 10/25/2018  . Snoring 10/25/2018  . Generalized anxiety disorder with panic attacks 09/20/2018  . Chronic dyspnea 09/20/2018  . Family history of sarcoidosis 09/20/2018  . Class 2 obesity due to excess calories in adult 09/20/2018  . Passive smoke exposure 09/20/2018  . Deliberate self-cutting 09/20/2018  . Male-to-male transgender person 05/02/2018    Past Surgical History:  Procedure Laterality Date  . MASTECTOMY    . NO PAST SURGERIES      OB History   No obstetric history on file.      Home Medications    Prior to Admission medications   Medication Sig Start Date End Date Taking? Authorizing Provider  cephALEXin (KEFLEX) 500 MG capsule Take 1 capsule (500 mg total) by mouth 2 (two) times daily. 06/13/20  Yes Angala Hilgers O, PA-C  albuterol (VENTOLIN HFA) 108 (90 Base) MCG/ACT inhaler Inhale 2 puffs into the lungs every 6 (six) hours as needed for wheezing or shortness of breath. 03/14/19   Luciano Cutter, MD  ALPRAZolam Prudy Feeler) 0.25 MG tablet Take 1 tablet (0.25 mg total) by mouth once as needed for up to 1 dose for anxiety. 12/16/18    Carlis Stable, PA-C  Biotin 1 MG CAPS Take 1 capsule by mouth daily.    [provider]  budesonide-formoterol (SYMBICORT) 160-4.5 MCG/ACT inhaler Inhale 2 puffs into the lungs 2 (two) times daily. 03/14/19   Luciano Cutter, MD  escitalopram (LEXAPRO) 5 MG tablet Take 3 tablets (15 mg total) by mouth daily. MUST MAKE APPOINTMENT FOR FUTURE REFILLS 06/22/19   Tandy Gaw L, PA-C  hydrOXYzine (ATARAX/VISTARIL) 10 MG tablet Take 1-5 tablets (10-50 mg total) by mouth at bedtime as needed for anxiety (insomnia). Gradually increase by 1 tablet every 3 nights as needed for sleep. Max dose 50 mg 12/30/18   Carlis Stable, PA-C  Multiple Vitamin (MULTIVITAMIN) capsule Take 1 capsule by mouth daily.    [provider]  NEEDLE, DISP, 25 G (BD HYPODERMIC NEEDLE) 25G X 1-1/2" MISC For testosterone Injection 04/28/18   [provider]  ondansetron (ZOFRAN-ODT) 4 MG disintegrating tablet Take 1 tablet (4 mg total) by mouth every 8 (eight) hours as needed for nausea or vomiting. 01/28/19   Carlis Stable, PA-C  SYMBICORT 80-4.5 MCG/ACT inhaler TAKE 2 PUFFS BY MOUTH TWICE A DAY Patient not taking: Reported on 05/17/2020 01/10/19   Carlis Stable, PA-C  SYRINGE-NEEDLE, DISP, 3 ML (B-D 3CC LUER-LOK SYR 18GX1-1/2) 18G X 1-1/2" 3 ML MISC For drawing Testosterone 04/28/18  [provider]  Testosterone Cypionate 200 MG/ML SOLN Inject 120 mg into the muscle every 14 (fourteen) days. 04/28/18   [provider]    Family History Family History  Problem Relation Age of Onset  . Sarcoidosis Mother   . COPD Maternal Uncle   . Emphysema Maternal Uncle     Social History Social History   Tobacco Use  . Smoking status: Passive Smoke Exposure - Never Smoker  . Smokeless tobacco: Never Used  Vaping Use  . Vaping Use: Never used  Substance Use Topics  . Alcohol use: Not Currently    Comment: 1 drink/year  . Drug use:  Never     Allergies   Patient has no known allergies.   Review of Systems Review of Systems  Constitutional: Negative for chills and fever.  Gastrointestinal: Positive for abdominal pain. Negative for diarrhea, nausea and vomiting.  Genitourinary: Positive for dysuria, frequency and urgency. Negative for hematuria.     Physical Exam Triage Vital Signs ED Triage Vitals  Enc Vitals Group     BP 06/13/20 1430 113/80     Pulse Rate 06/13/20 1430 97     Resp 06/13/20 1430 16     Temp 06/13/20 1430 98.1 F (36.7 C)     Temp Source 06/13/20 1430 Oral     SpO2 06/13/20 1430 98 %     Weight --      Height --      Head Circumference --      Peak Flow --      Pain Score 06/13/20 1429 2     Pain Loc --      Pain Edu? --      Excl. in GC? --    No data found.  Updated Vital Signs BP 113/80 (BP Location: Right Arm)   Pulse 97   Temp 98.1 F (36.7 C) (Oral)   Resp 16   SpO2 98%   Visual Acuity Right Eye Distance:   Left Eye Distance:   Bilateral Distance:    Right Eye Near:   Left Eye Near:    Bilateral Near:     Physical Exam Vitals and nursing note reviewed.  Constitutional:      Appearance: Normal appearance. He is well-developed and well-nourished.  HENT:     Head: Normocephalic and atraumatic.  Eyes:     Extraocular Movements: EOM normal.  Cardiovascular:     Rate and Rhythm: Normal rate and regular rhythm.  Pulmonary:     Effort: Pulmonary effort is normal. No respiratory distress.     Breath sounds: Normal breath sounds.  Abdominal:     General: There is no distension.     Palpations: Abdomen is soft.     Tenderness: There is no abdominal tenderness. There is no right CVA tenderness or left CVA tenderness.  Musculoskeletal:        General: Normal range of motion.     Cervical back: Normal range of motion.  Skin:    General: Skin is warm and dry.  Neurological:     Mental Status: He is alert and oriented to person, place, and time.  Psychiatric:         Mood and Affect: Mood and affect normal.        Behavior: Behavior normal.      UC Treatments / Results  Labs (all labs ordered are listed, but only abnormal results are displayed) Labs Reviewed  POCT URINALYSIS DIP (MANUAL ENTRY) - Abnormal; Notable for  the following components:      Result Value   Clarity, UA turbid (*)    Blood, UA large (*)    Protein Ur, POC =100 (*)    Leukocytes, UA Moderate (2+) (*)    All other components within normal limits  URINE CULTURE    EKG   Radiology No results found.  Procedures Procedures (including critical care time)  Medications Ordered in UC Medications - No data to display  Initial Impression / Assessment and Plan / UC Course  I have reviewed the triage vital signs and the nursing notes.  Pertinent labs & imaging results that were available during my care of the patient were reviewed by me and considered in my medical decision making (see chart for details).     Hx and UA c/w UTI Urine culture sent Will start on keflex  Pt reports hx of yeast infections with antibiotics, diflucan also sent to pharmacy. F/u with PCP as needed  Final Clinical Impressions(s) / UC Diagnoses   Final diagnoses:  Acute cystitis with hematuria     Discharge Instructions      Please take your antibiotic as prescribed. A urine culture has been sent to check the severity of your urinary infection and to determine if you are on the most appropriate antibiotic. The results should come back within 2-3 days. You will only be notified if a medication change is indicated.  Please follow up with family medicine or urology if not improving within 1 week, sooner if symptoms worsening.      ED Prescriptions    Medication Sig Dispense Auth. Provider   cephALEXin (KEFLEX) 500 MG capsule Take 1 capsule (500 mg total) by mouth 2 (two) times daily. 14 capsule Doroteo Glassman, Ainsley Sanguinetti O, PA-C   fluconazole (DIFLUCAN) 200 MG tablet Take 1 tablet (200 mg total)  by mouth once for 1 dose. May repeat in 3 days if still symptomatic 2 tablet Lurene Shadow, PA-C     PDMP not reviewed this encounter.   Lurene Shadow, [place] 06/14/20 1106

## 2020-06-13 NOTE — Discharge Instructions (Signed)
  Please take your antibiotic as prescribed. A urine culture has been sent to check the severity of your urinary infection and to determine if you are on the most appropriate antibiotic. The results should come back within 2-3 days. You will only be notified if a medication change is indicated.  Please follow up with family medicine or urology if not improving within 1 week, sooner if symptoms worsening.   

## 2020-06-13 NOTE — ED Triage Notes (Signed)
Patient presents to Urgent Care with complaints of urinary frequency since yesterday. Patient reports they started to notice some blood in the urine, pressure and pain in lower abdomen.

## 2020-06-15 LAB — URINE CULTURE
MICRO NUMBER:: 11463223
SPECIMEN QUALITY:: ADEQUATE

## 2021-02-04 IMAGING — DX CHEST - 2 VIEW
2 series · 2 of 2 positions shown · non-contrast
Comparison: None.

CLINICAL DATA: Shortness of breath

EXAM:
CHEST - 2 VIEW

[chest pa]
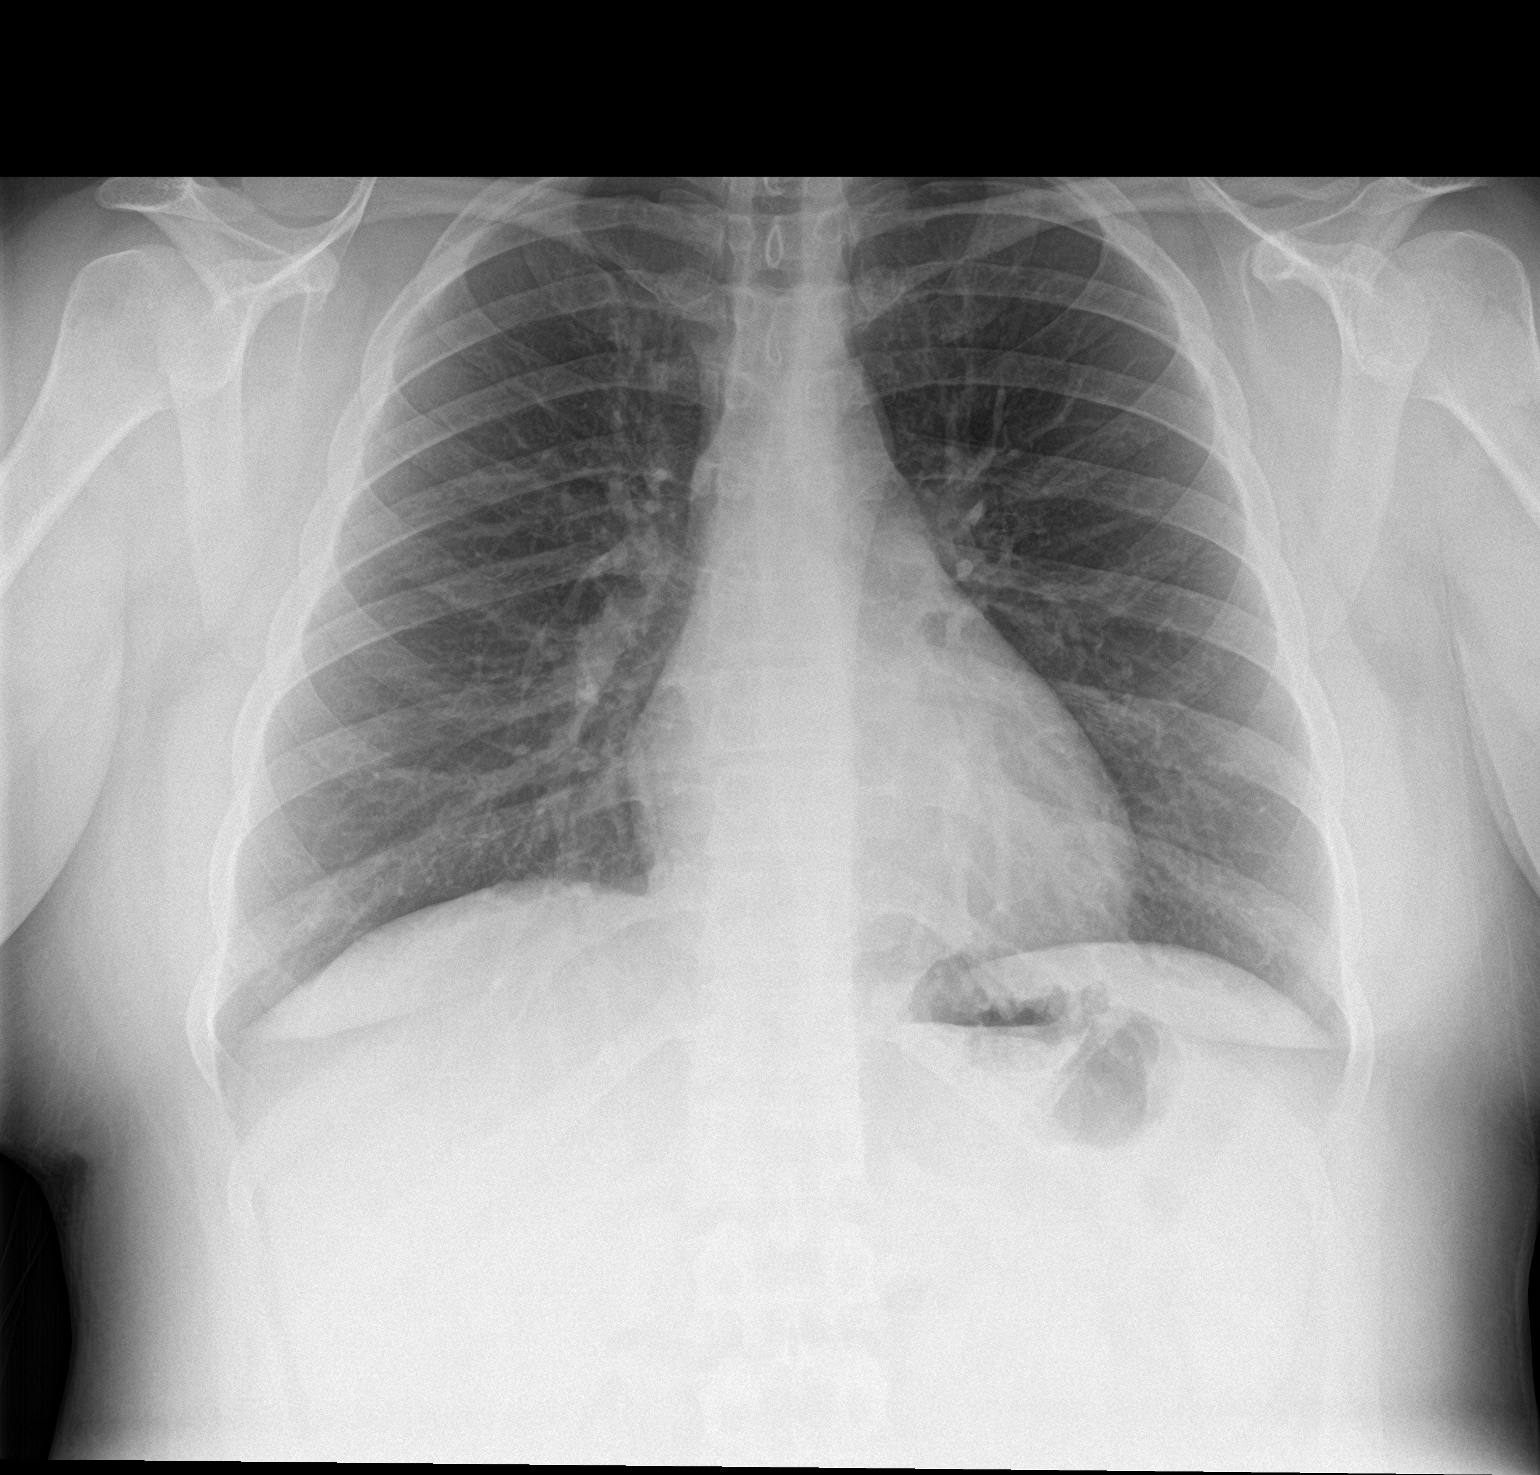

[chest lat]
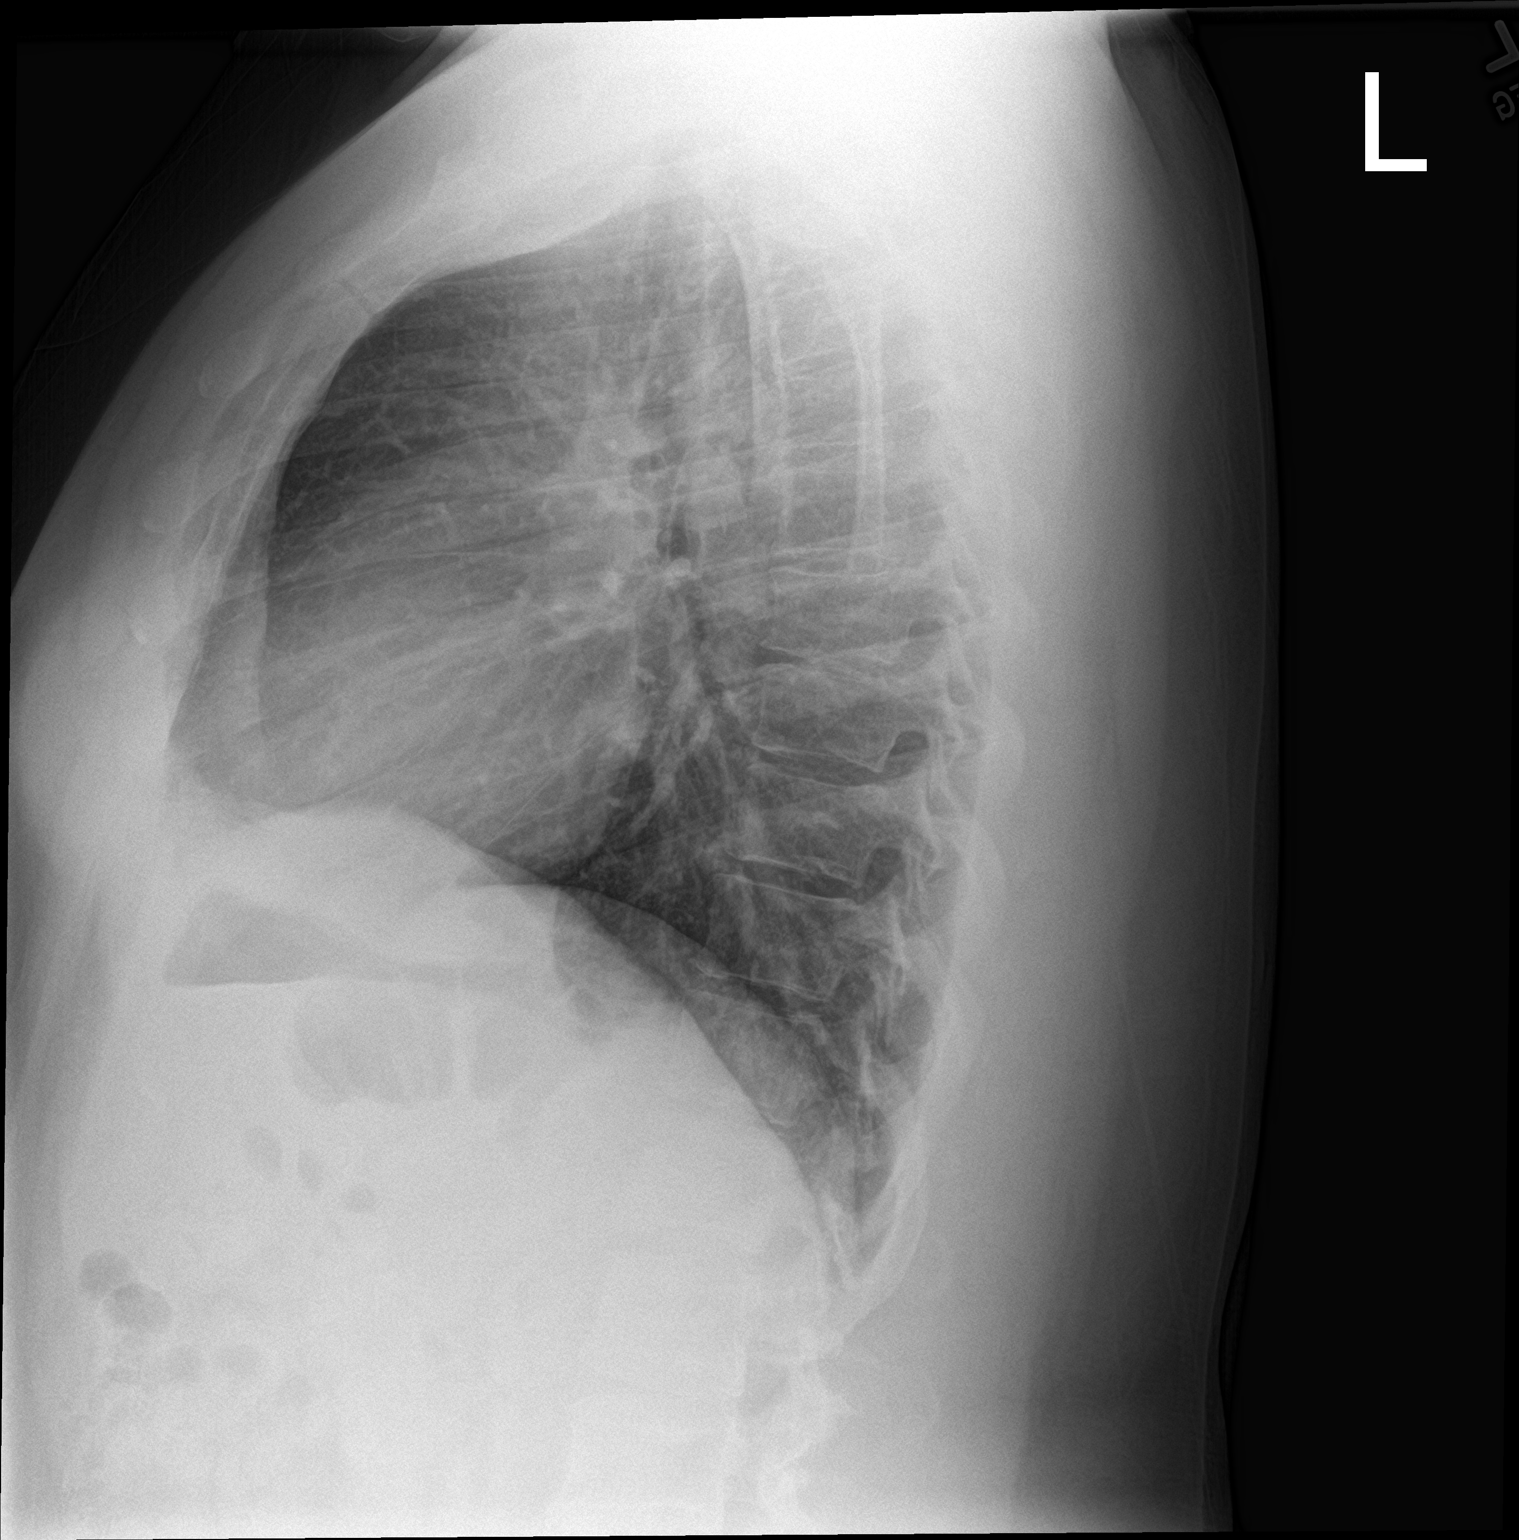

[2 of 2 positions shown; findings below may reference images not displayed]

FINDINGS: The heart size and mediastinal contours are within normal limits.
Both lungs are clear. The visualized skeletal structures are
unremarkable.
IMPRESSION: No active cardiopulmonary disease.
